# Patient Record
Sex: Male | Born: 1965 | Race: White | Hispanic: No | Marital: Married | Smoking: Never smoker
Health system: Southern US, Community
[De-identification: ages and names within clinical notes are randomized; demographics above are authoritative.]

## PROBLEM LIST (undated history)

## (undated) DIAGNOSIS — M1990 Unspecified osteoarthritis, unspecified site: Secondary | ICD-10-CM

## (undated) DIAGNOSIS — F419 Anxiety disorder, unspecified: Secondary | ICD-10-CM

## (undated) DIAGNOSIS — M4800 Spinal stenosis, site unspecified: Secondary | ICD-10-CM

## (undated) DIAGNOSIS — E119 Type 2 diabetes mellitus without complications: Secondary | ICD-10-CM

## (undated) DIAGNOSIS — Z9889 Other specified postprocedural states: Secondary | ICD-10-CM

## (undated) DIAGNOSIS — J45909 Unspecified asthma, uncomplicated: Secondary | ICD-10-CM

## (undated) DIAGNOSIS — I10 Essential (primary) hypertension: Secondary | ICD-10-CM

## (undated) DIAGNOSIS — L03032 Cellulitis of left toe: Secondary | ICD-10-CM

## (undated) HISTORY — PX: HERNIA REPAIR: SHX51

---

## 2000-07-01 ENCOUNTER — Encounter: Payer: Self-pay | Admitting: General Surgery

## 2000-07-01 ENCOUNTER — Ambulatory Visit (HOSPITAL_COMMUNITY): Admission: RE | Admit: 2000-07-01 | Discharge: 2000-07-01 | Payer: Self-pay | Admitting: General Surgery

## 2001-01-05 ENCOUNTER — Encounter: Admission: RE | Admit: 2001-01-05 | Discharge: 2001-01-05 | Payer: Self-pay | Admitting: Neurology

## 2001-01-05 ENCOUNTER — Encounter: Payer: Self-pay | Admitting: Neurology

## 2001-12-23 ENCOUNTER — Encounter: Admission: RE | Admit: 2001-12-23 | Discharge: 2001-12-23 | Payer: Self-pay | Admitting: Family Medicine

## 2001-12-23 ENCOUNTER — Encounter: Payer: Self-pay | Admitting: Family Medicine

## 2002-06-20 ENCOUNTER — Encounter: Admission: RE | Admit: 2002-06-20 | Discharge: 2002-06-20 | Payer: Self-pay | Admitting: Sports Medicine

## 2002-06-20 ENCOUNTER — Encounter: Payer: Self-pay | Admitting: Sports Medicine

## 2002-07-04 ENCOUNTER — Encounter: Payer: Self-pay | Admitting: Sports Medicine

## 2002-07-04 ENCOUNTER — Encounter: Admission: RE | Admit: 2002-07-04 | Discharge: 2002-07-04 | Payer: Self-pay | Admitting: Sports Medicine

## 2002-08-10 ENCOUNTER — Encounter: Admission: RE | Admit: 2002-08-10 | Discharge: 2002-11-08 | Payer: Self-pay | Admitting: Family Medicine

## 2002-11-09 ENCOUNTER — Other Ambulatory Visit (HOSPITAL_COMMUNITY): Admission: RE | Admit: 2002-11-09 | Discharge: 2002-11-16 | Payer: Self-pay | Admitting: *Deleted

## 2002-12-05 ENCOUNTER — Encounter: Admission: RE | Admit: 2002-12-05 | Discharge: 2002-12-05 | Payer: Self-pay | Admitting: *Deleted

## 2003-01-11 ENCOUNTER — Encounter: Admission: RE | Admit: 2003-01-11 | Discharge: 2003-01-11 | Payer: Self-pay | Admitting: *Deleted

## 2003-02-19 ENCOUNTER — Emergency Department (HOSPITAL_COMMUNITY): Admission: EM | Admit: 2003-02-19 | Discharge: 2003-02-19 | Payer: Self-pay | Admitting: Emergency Medicine

## 2003-03-27 ENCOUNTER — Emergency Department (HOSPITAL_COMMUNITY): Admission: EM | Admit: 2003-03-27 | Discharge: 2003-03-27 | Payer: Self-pay | Admitting: Emergency Medicine

## 2003-03-30 ENCOUNTER — Emergency Department (HOSPITAL_COMMUNITY): Admission: EM | Admit: 2003-03-30 | Discharge: 2003-03-30 | Payer: Self-pay | Admitting: Emergency Medicine

## 2005-07-28 ENCOUNTER — Ambulatory Visit (HOSPITAL_COMMUNITY): Payer: Self-pay | Admitting: Psychiatry

## 2006-10-11 ENCOUNTER — Emergency Department (HOSPITAL_COMMUNITY): Admission: EM | Admit: 2006-10-11 | Discharge: 2006-10-11 | Payer: Self-pay | Admitting: Emergency Medicine

## 2006-10-13 ENCOUNTER — Ambulatory Visit: Payer: Self-pay | Admitting: Internal Medicine

## 2006-11-02 ENCOUNTER — Ambulatory Visit: Payer: Self-pay | Admitting: Internal Medicine

## 2006-11-02 LAB — CONVERTED CEMR LAB
ALT: 46 units/L — ABNORMAL HIGH (ref 0–40)
AST: 35 units/L (ref 0–37)
Albumin: 4.8 g/dL (ref 3.5–5.2)
Alkaline Phosphatase: 53 units/L (ref 39–117)
BUN: 12 mg/dL (ref 6–23)
Basophils Absolute: 0 10*3/uL (ref 0.0–0.1)
Basophils Relative: 0.5 % (ref 0.0–1.0)
CO2: 29 meq/L (ref 19–32)
Calcium: 9.8 mg/dL (ref 8.4–10.5)
Chloride: 102 meq/L (ref 96–112)
Creatinine, Ser: 1 mg/dL (ref 0.4–1.5)
Eosinophils Absolute: 0.5 10*3/uL (ref 0.0–0.6)
Eosinophils Relative: 6.3 % — ABNORMAL HIGH (ref 0.0–5.0)
GFR calc Af Amer: 106 mL/min
GFR calc non Af Amer: 88 mL/min
Glucose, Bld: 98 mg/dL (ref 70–99)
HCT: 40.1 % (ref 39.0–52.0)
Hemoglobin: 14.4 g/dL (ref 13.0–17.0)
IgE (Immunoglobulin E), Serum: 10.9 intl units/mL (ref 0.0–180.0)
Lymphocytes Relative: 26.1 % (ref 12.0–46.0)
MCHC: 36 g/dL (ref 30.0–36.0)
MCV: 92.1 fL (ref 78.0–100.0)
Monocytes Absolute: 0.5 10*3/uL (ref 0.2–0.7)
Monocytes Relative: 6.6 % (ref 3.0–11.0)
Neutro Abs: 4.9 10*3/uL (ref 1.4–7.7)
Neutrophils Relative %: 60.5 % (ref 43.0–77.0)
Platelets: 252 10*3/uL (ref 150–400)
Potassium: 4.3 meq/L (ref 3.5–5.1)
RBC: 4.35 M/uL (ref 4.22–5.81)
RDW: 11.8 % (ref 11.5–14.6)
Sodium: 138 meq/L (ref 135–145)
TSH: 1.31 microintl units/mL (ref 0.35–5.50)
Total Bilirubin: 0.8 mg/dL (ref 0.3–1.2)
Total Protein: 7.5 g/dL (ref 6.0–8.3)
WBC: 8 10*3/uL (ref 4.5–10.5)

## 2009-05-09 DIAGNOSIS — M549 Dorsalgia, unspecified: Secondary | ICD-10-CM | POA: Insufficient documentation

## 2009-05-09 DIAGNOSIS — J310 Chronic rhinitis: Secondary | ICD-10-CM

## 2009-05-09 DIAGNOSIS — J45909 Unspecified asthma, uncomplicated: Secondary | ICD-10-CM | POA: Insufficient documentation

## 2009-05-10 ENCOUNTER — Ambulatory Visit: Payer: Self-pay | Admitting: Internal Medicine

## 2011-02-21 NOTE — Op Note (Signed)
[facility]  Patient:    Blake Jackson, Blake Jackson                     MRN: [id number] Proc. Date: 07/01/00 Adm. Date:  [id number] Attending:  Glenna Fellows Tappan                           Operative Report  PREOPERATIVE DIAGNOSIS:  Right inguinal hernia.  POSTOPERATIVE DIAGNOSIS:  Right inguinal hernia.  PROCEDURE:  Laparoscopic repair right inguinal hernia.  SURGEON:  Lorne Skeens. Hoxworth, M.D.  ANESTHESIA:  General.  BRIEF HISTORY:  Blake Jackson is a 45 year old white male who presents with intermittent pain and bulge in his right groin and on exam has a confirmed right inguinal hernia.  There is no hernia discernible on the left.  Options for repair have been discussed, and we elected to proceed with laparoscopic repair.  The nature of the procedure, its indications, risks of bleeding, infection, and recurrence were discussed and were understood preoperatively. He is now brought to the operating room for this procedure.  DESCRIPTION OF PROCEDURE:  The patient was brought to the operating room and placed in a supine position on the operating table, and general endotracheal anesthesia was induced.  Antibiotics were given preoperatively.  The abdomen and groin were sterilely prepped and draped.  Local anesthesia was used to infiltrate the trocar sites prior to the incision.  A 1 cm incision was made at the umbilicus and dissection carried down to the anterior fascia.  This was transversely incised for 1 cm, and the edge of the right rectus muscle retracted laterally and the preperitoneal space entered bluntly.  The balloon dissector was carefully passed into this space and passed with its tip to the pubic symphysis.  It was then inflated with good bilateral deployment of the balloon.  It was left in place for three minutes and then removed, and the structural balloon placed and CO2 applied.  Laparoscopy revealed adequate dissection of the preperitoneal  space, somewhat better on the right than the left.  I did not dissect the left side.  The symphysis was identified and the Coopers ligament cleared down to the iliac vessels.  The iliac vessels and epigastric vessels were identified.  The peritoneum was further dissected off the anterior abdominal wall laterally out to the anterior superior iliac spine.  The edge of the peritoneum was traced back medially.  The cord structures were identified and were skeletonized.  A good sized cord lipoma was dissected off of the cord structures out of the internal ring and dissected posteriorly.  The edge of the peritoneum was found on the cord structures and was dissected well back posteriorly, and there was no indirect sac.  There was a moderate sized direct hernia found medial to the epigastric vessels.  When the anatomy was clear and after complete dissection, a piece of Prolene mesh was trimmed to 4 x 6 inches and marked in place into the preperitoneal space.  It was unfurled and positioned, and tacked initially to the pubic symphysis and then along the Coopers ligament down to the iliac vessels carefully protecting them.  The mesh was then tacked from caudad to cephalad along the midline. and then the superior edge of the mesh was tacked from medial to lateral, avoiding the epigastric vessels and then tacking it out to the anterior superior iliac spine.  All tacks were placed where they could be felt through  the anterior abdominal wall to avoid nerve injury.  This effected good wide coverage of the direct and indirect spaces.  Peritoneum and cord and lipoma were placed deep to the mesh.  While holding the inferolateral of the corner of the mesh in place, all CO2 was evacuated and the trocars removed.  The fascial defect at the umbilicus was closed with two figure-of-eight sutures of 0 Vicryl.  Skin was closed with interrupted subcuticular 4-0 Monocryl.  Benzoin and Steri-Strips were applied.   Sponge, needle and instrument counts correct.  Dry sterile dressings were applied, and the patient was taken to recover in good condition. DD:  07/01/00 TD:  07/01/00 Job: [id number]

## 2011-02-21 NOTE — Assessment & Plan Note (Signed)
[facility]                             PULMONARY OFFICE NOTE   Blake Jackson, Blake Jackson                     MRN:          [id number]  DATE:10/13/2006                            DOB:          09-30-1966    REASON FOR CONSULTATION:  Cough and dyspnea.   HISTORY:  This is a 45 year old white male who carried a diagnosis of  allergic rhinitis as a child, in fact took shots until he was 37 years  old and outgrew it.  However, over the last year he has been having  intermittent cough that comes and goes along with subjective wheezing  and dyspnea requiring albuterol several times a week, but for the last  month has had to use albuterol several times per day and even now is  only partially responding to albuterol.  Associated with worsening  dyspnea and cough he has had a sensation of excess drainage, and  actually has had discolored sputum for which he has received at least  one course of Zithromax without any benefit in terms of the color of the  sputum.  He was seen in the emergency room and placed on prednisone on  January 6, but is only feeling maybe 50% better since then.  He denies  any pleuritic pain, overt sinus or reflux symptoms at present.   PAST MEDICAL HISTORY:  Significant for asthma and allergies, as noted  above, and chronic back pain for he has been on narcotics for years.   ALLERGIES:  None known.   MEDICATIONS:  Kadian and Cymbalta daily, along with Xanax p.r.n.   SOCIAL HISTORY:  He has never smoked.  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   FAMILY HISTORY:  Possibly positive for allergies and asthma, but he is  not sure.  Otherwise negative as recorded on the work sheet.   REVIEW OF SYSTEMS:  Also recorded on the work sheet and reviewed with  the patient, negative except as outlined above.   PHYSICAL EXAMINATION:  This is a very anxious but not overtly ill,  ambulatory white male in no acute distress.  He is  afebrile, stable vital signs.  HEENT:  Significant for mild nonspecific turbinate edema.  Oropharynx is  clear, no excessive postnasal drainage or cobblestoning. Dentition is  intact.  NECK:  Supple without cervical adenopathy or tenderness.  The trachea  was midline, no thyromegaly.  LUNGS:  Lung fields reveal inspiratory and expiratory rhonchi  bilaterally with diminished but adequate air movement.  There is a regular rhythm without murmur, gallop or rub. S1, S2  diminished.  ABDOMEN:  Moderately obese, otherwise benign.  EXTREMITIES:  Warm without calf tenderness, cyanosis, clubbing or edema.   MDI technique was reviewed, and is only marginally adequate.   Chest x-ray is reviewed from October 11, 2006, and is normal.   Hemoglobin saturation is 96% on room air today.   IMPRESSION:  Rhinitis tracheobronchitis in a patient who has probably  had active asthma for years, but definitely symptomatic for the last  year, with excess use of beta 2 agonists indicating significant  airways  inflammation.  The differential diagnosis includes chronic sinusitis,  especially since he did not clear with antibiotics in terms of his  purulent sputum, and also overt reflux based on his obesity.   I reviewed with him short-term treatment directed at asthma, rhinitis  and reflux as follows:  1. I initiated treatment with Symbicort 80/4.5 two puffs b.i.d., and      asked him to try to eliminate the albuterol completely if possible      while adding Zegerid 40 mg nightly and Levaquin 750 for 7 days to      his regimen.  When he returns in 2 weeks if not improved a sinus CT      scan needs to be done.  2. For his nasal congestion I would like him to use nothing more than      Ocean nasal spray, and for cough and congestion Mucinex DM two      b.i.d., preserving albuterol for true rescue circumstances.      Follow up with me in 2 weeks, sooner if needed.  In terms of his      current prednisone taper, I  have given him an additional 6 days to      get him over the hump in terms of controlling acute airways      inflammation until Symbicort has a chance to be fully effective.     Charlaine Dalton. [name], MD, [facility]  Electronically Signed    MBW/MedQ  DD: 10/13/2006  DT: 10/14/2006  Job #: [id number]   cc:   Chales Salmon. Abigail Miyamoto, M.D.

## 2011-02-21 NOTE — Assessment & Plan Note (Signed)
[facility]                             PULMONARY OFFICE NOTE   Blake Jackson, Blake Jackson                     MRN:          [id number]  DATE:11/02/2006                            DOB:          [birth year]    PULMONARY/EXTENDED FOLLOWUP OFFICE VISIT   HISTORY:  This is a 45 year old white male, never-smoker, with a history  of chronic rhinitis and subjective wheezing with cough with partial but  no full response to prednisone.  I saw him on October 13, 2006, after  seen in the emergency room and only reporting 50% improvement and still  using albuterol excessively.  I recommended Symbicort 80/4.5 two puffs  b.i.d. and a short course of prednisone.  He was suppose to return but,  because of a scheduling conflict, actually ran out of his medicines and  comes back today much worse and, again, only it may be 50% better even  while on prednisone.   The patient complains of subjective wheezing with generalized chest  tightness and a hacking cough productive of scant amounts of green  mucus.  He denies any fevers, chills, or pleuritic pain, overt reflux  symptoms, orthopnea, PND, or leg swelling.   PHYSICAL EXAMINATION:  He is an unhealthy appearing, white male in no  acute distress who appears a bit tremulous.  He is afebrile on vital signs with a pulse rate of only 74.  HEENT:  Remarkably unremarkable.  Oropharynx is clear.  Nasal turbinates  are normal.  External ear canals were clear bilaterally.  NECK:  Supple without cervical adenopathy or tenderness.  Trachea was  midline with no organomegaly.  Lung fields revealed bilateral late expiratory wheezes with poor air  movement overall.  HEART:  Regular rate and rhythm without murmur, gallop, or rub.  ABDOMEN:  Soft, benign.  EXTREMITIES:  Warm without calf tenderness, cyanosis, clubbing, or  edema.   Hemoglobin saturation was 94% on room air.   Lab work is pending.   IMPRESSION:  Extended discussion  with this patient lasting 15-20 minutes  of a 25-minute visit on the following topics:  1. The symptoms seem disproportionate to objective findings and note      that even on prednisone he only gets 50% improvement.  Therefore,      at least part of his problem may not be actually asthma related.      However, the component that is asthma related needs to be maximally      reversed and, therefore, I recommended increasing Symbicort to      160/4.5 and gave him an extra 2 weeks of samples, documenting that      he improves his MDI technique from 50% to 75%.  I also emphasized      compliance with diet directed at gastroesophageal reflux disease      and recommended for breakthrough wheezing to use albumin 2 puffs      every 4 hours.  For cough to use Mucinex-DM 1-2 every 12 hours and      reminded him that he can see either nurse practitioner or me in the  event that a scheduling conflict occurs again.  2. I did give him a 7-day course of doxycycline and another 6-day      course of prednisone because he is reporting slightly purulent      sputum and recommended a sinus CT scan on return if not improving.  3. An IgE level and eosinophil level are pending at the time of this      dictation.     Charlaine Dalton. [name], MD, [facility]  Electronically Signed    MBW/MedQ  DD: 11/02/2006  DT: 11/02/2006  Job #: [id number]

## 2012-04-16 ENCOUNTER — Other Ambulatory Visit: Payer: Self-pay | Admitting: Physician Assistant

## 2012-04-16 DIAGNOSIS — M545 Low back pain: Secondary | ICD-10-CM

## 2012-04-19 ENCOUNTER — Ambulatory Visit
Admission: RE | Admit: 2012-04-19 | Discharge: 2012-04-19 | Disposition: A | Discharge status: Home / Self Care | Payer: BC Managed Care – PPO | Source: Ambulatory Visit | Attending: Physician Assistant | Admitting: Physician Assistant

## 2012-04-19 DIAGNOSIS — M545 Low back pain: Secondary | ICD-10-CM

## 2012-04-20 ENCOUNTER — Ambulatory Visit
Admission: RE | Admit: 2012-04-20 | Discharge: 2012-04-20 | Disposition: A | Discharge status: Home / Self Care | Payer: BC Managed Care – PPO | Source: Ambulatory Visit | Attending: Physician Assistant | Admitting: Physician Assistant

## 2012-04-20 DIAGNOSIS — M545 Low back pain: Secondary | ICD-10-CM

## 2012-06-02 ENCOUNTER — Other Ambulatory Visit: Payer: Self-pay | Admitting: Family Medicine

## 2012-06-11 ENCOUNTER — Other Ambulatory Visit: Payer: BC Managed Care – PPO

## 2019-10-11 ENCOUNTER — Ambulatory Visit (HOSPITAL_COMMUNITY)
Admission: RE | Admit: 2019-10-11 | Discharge: 2019-10-11 | Disposition: A | Discharge status: Home / Self Care | Payer: Self-pay | Attending: Psychiatry | Admitting: Psychiatry

## 2019-10-11 ENCOUNTER — Encounter (HOSPITAL_COMMUNITY): Payer: Self-pay | Admitting: Psychiatry

## 2019-10-11 DIAGNOSIS — F332 Major depressive disorder, recurrent severe without psychotic features: Secondary | ICD-10-CM | POA: Insufficient documentation

## 2019-10-11 NOTE — BH Assessment (Signed)
Assessment Note  Blake Jackson is an 54 y.o. male who presented to [facility] as a walk-in seeking counseling services for his depression.  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  He states that he has been treated for depression in the past with Effexor, but states that he has not been on medications in a couple of years.  Patient states that he has experienced several stressors which have increased his depression.  He states that Covid has kept him from being able to see his mother in the nursing home, his kids are grown and have moved away and he states that he is divorced.  Patient states that he has chronic pain issues that contribute to his depression.  Patient denies any previous or current SI/HI/Psychosis.  He states that he is not a user of drugs or alcohol.  He denies any history of abuse or self-mutilation.  He states that his sleep and appetite have been good.  Patient states that he feels like he just needs someone to talk to and to possibly get back on medications.  He states that he was referred to [facility] for an assessment, but he did not realize that this was an inpatient facility and states that he does not need this level of care. He was alert and oriented, he was able to contract for safety to follow-up with [facility] for therapy and medication management.  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   Family History: No family history on file.  Social History:  reports that he does not drink alcohol or use drugs. No history on file for tobacco.  Additional Social History:  Alcohol / Drug Use Pain Medications: see MAR Prescriptions: see MAR Over the Counter: see MAR History of alcohol / drug use?: No history of alcohol / drug abuse Longest period of sobriety (when/how long): N/A  CIWA: CIWA-Ar BP: 136/90 Pulse Rate: (!) 102 COWS:    Allergies: Not on  File  Home Medications: (Not in a hospital admission)   OB/GYN Status:  No LMP for male patient.  General Assessment Data TTS Assessment: In system Is this a Tele or Face-to-Face Assessment?: Face-to-Face Is this an Initial Assessment or a Re-assessment for this encounter?: Initial Assessment Patient Accompanied by:: Other(friend) Language Other than English: No Living Arrangements: Other (Comment)(has his own home) What gender do you identify as?: Male Marital status: Divorced Living Arrangements: Alone Can pt return to current living arrangement?: Yes Admission Status: Voluntary Is patient capable of signing voluntary admission?: Yes Referral Source: Self/Family/Friend Insurance type: Product/process development scientist Exam [facility] Walk-in ONLY) Medical Exam completed: Yes  Crisis Care Plan Living Arrangements: Alone Legal Guardian: Other:(self) Name of Psychiatrist: none Name of Therapist: none  Education Status Is patient currently in school?: No Is the patient employed, unemployed or receiving disability?: Employed  Risk to self with the past 6 months Suicidal Ideation: No Has patient been a risk to self within the past 6 months prior to admission? : No Suicidal Intent: No Has patient had any suicidal intent within the past 6 months prior to admission? : No Is patient at risk for suicide?: No Suicidal Plan?: No Has patient had any suicidal plan within the past 6 months prior to admission? : No Access to Means: No What has been your use of drugs/alcohol within the last 12 months?: none Previous Attempts/Gestures: No How many  times?: 0 Other Self Harm Risks: none Triggers for Past Attempts: None known Intentional Self Injurious Behavior: None Family Suicide History: No Recent stressful life event(s): Other (Comment) Persecutory voices/beliefs?: (Covid, Mother in Nursing Home, minimal support ) Depression: Yes Depression Symptoms: Despondent, Isolating, Loss of interest in  usual pleasures Substance abuse history and/or treatment for substance abuse?: No Suicide prevention information given to non-admitted patients: Not applicable  Risk to Others within the past 6 months Homicidal Ideation: No Does patient have any lifetime risk of violence toward others beyond the six months prior to admission? : No Thoughts of Harm to Others: No Current Homicidal Intent: No Current Homicidal Plan: No Access to Homicidal Means: No Identified Victim: none History of harm to others?: No Assessment of Violence: None Noted Violent Behavior Description: (none) Does patient have access to weapons?: No Criminal Charges Pending?: No Does patient have a court date: No Is patient on probation?: No  Psychosis Hallucinations: None noted Delusions: None noted  Mental Status Report Appearance/Hygiene: Unremarkable Eye Contact: Good Motor Activity: Rigidity, Unremarkable Speech: Logical/coherent Level of Consciousness: Alert Mood: Depressed, Anxious Affect: Depressed Anxiety Level: Moderate Thought Processes: Coherent, Relevant Judgement: Unimpaired Orientation: Person, Place, Time, Situation Obsessive Compulsive Thoughts/Behaviors: None  Cognitive Functioning Concentration: Normal Memory: Recent Intact, Remote Intact Is patient IDD: No Insight: Good Impulse Control: Good Appetite: Good Have you had any weight changes? : No Change Sleep: No Change Total Hours of Sleep: 8 Vegetative Symptoms: None  ADLScreening [facility] Assessment Services) Patient's cognitive ability adequate to safely complete daily activities?: Yes Patient able to express need for assistance with ADLs?: Yes Independently performs ADLs?: Yes (appropriate for developmental age)  Prior Inpatient Therapy Prior Inpatient Therapy: No  Prior Outpatient Therapy Prior Outpatient Therapy: No Does patient have an ACCT team?: No Does patient have Intensive In-House Services?  : No Does patient have  [facility] services? : No Does patient have P4CC services?: No  ADL Screening (condition at time of admission) Patient's cognitive ability adequate to safely complete daily activities?: Yes Is the patient deaf or have difficulty hearing?: No Does the patient have difficulty seeing, even when wearing glasses/contacts?: No Does the patient have difficulty concentrating, remembering, or making decisions?: No Patient able to express need for assistance with ADLs?: Yes Does the patient have difficulty dressing or bathing?: No Independently performs ADLs?: Yes (appropriate for developmental age) Does the patient have difficulty walking or climbing stairs?: No Weakness of Legs: None Weakness of Arms/Hands: None  Home Assistive Devices/Equipment Home Assistive Devices/Equipment: None  Therapy Consults (therapy consults require a physician order) PT Evaluation Needed: No OT Evalulation Needed: No SLP Evaluation Needed: No Abuse/Neglect Assessment (Assessment to be complete while patient is alone) Abuse/Neglect Assessment Can Be Completed: Yes Physical Abuse: Denies Verbal Abuse: Denies Sexual Abuse: Denies Exploitation of patient/patient's resources: Denies Self-Neglect: Denies Values / Beliefs Cultural Requests During Hospitalization: None Spiritual Requests During Hospitalization: None Consults Spiritual Care Consult Needed: No Transition of Care Team Consult Needed: No Advance Directives (For Healthcare) Does Patient Have a Medical Advance Directive?: No Would patient like information on creating a medical advance directive?: No - Patient declined Nutrition Screen- [facility] Adult/[facility]/AP Has the patient recently lost weight without trying?: No Has the patient been eating poorly because of a decreased appetite?: No Malnutrition Screening Tool Score: 0        Disposition: Per Denzil Magnuson, NP, patient does not meet inpatient admission criteria.  Disposition Initial Assessment  Completed for this Encounter: Yes Disposition of Patient: Discharge  Patient refused recommended treatment: No Mode of transportation if patient is discharged/movement?: Car Patient referred to: Outpatient clinic referral  On Site Evaluation by:   Reviewed with Physician:    Judeth Porch Mada Sadik 10/11/2019 11:23 AM

## 2019-10-11 NOTE — H&P (Signed)
Behavioral Health Medical Screening Exam  Blake Jackson is an 54 y.o. male.who presents as a walk-inn to [facility] voluntarily. Patient denies SI, HI or psychosis.  He endorses a history of seasonal depression which worsens in the winter months. He identifies current triggers as his two older children living in other states, restrictions due to COVID, and not having much physical contact with his mother due to COVID restrictions. He endorses anxiety and reports lately, it has been difficult for him to be in crowds. Reports not having much joy in life. He denies prior suicide attempts or other acts of self-harm. Denies concerns with sleep although reports fluctuations  in appetite but no weight loss. Reports being on Effexor int he past for depression although reports he is on no medication for depression at this time. Reports having no current outpatient psychiatric services to include a therapist or psychiatrists. Reports there is a firearm in the home although he has never felt suicidal or had thoughts of using the firearm to harm himself. He denies substance abuse or use.   Total Time spent with patient: 20 minutes  Psychiatric Specialty Exam: Physical Exam  Vitals reviewed. Constitutional: He is oriented to person, place, and time.  Neurological: He is alert and oriented to person, place, and time.    Review of Systems  Psychiatric/Behavioral: Negative for sleep disturbance and suicidal ideas.       Depression   All other systems reviewed and are negative.   There were no vitals taken for this visit.There is no height or weight on file to calculate BMI.  General Appearance: Well Groomed  Eye Contact:  Good  Speech:  Clear and Coherent and Normal Rate  Volume:  Normal  Mood:  Anxious and Depressed  Affect:  Congruent  Thought Process:  Coherent, Goal Directed, Linear and Descriptions of Associations: Intact  Orientation:  Full (Time, Place, and Person)  Thought Content:  Logical  Suicidal  Thoughts:  No  Homicidal Thoughts:  No  Memory:  Immediate;   Fair Recent;   Fair  Judgement:  Fair  Insight:  Fair  Psychomotor Activity:  Normal  Concentration: Concentration: Fair and Attention Span: Fair  Recall:  [employer] of Knowledge:Fair  Language: Good  Akathisia:  Negative  Handed:  Right  AIMS (if indicated):     Assets:  Communication Skills Desire for Improvement Resilience Social Support  Sleep:       Musculoskeletal: Strength & Muscle Tone: within normal limits Gait & Station: normal Patient leans: N/A  There were no vitals taken for this visit.  Recommendations:  Based on my evaluation the patient does not appear to have an emergency medical condition.   No evidence of imminent risk to self or others at present.   Patient does not meet criteria for psychiatric inpatient admission. Community resources were provided for patient to get established with an outpatient therapist/ psychiatrists. IOP program was discussed as an option along with following up with his PCP to discuss treatment options for depression and anxiety of in-fact, he can not get a quick appointment with resources provided. We also discussed that of symptoms  worsen or do not continue to improve or if the patient becomes actively suicidal or homicidal then it is recommended that the patient return to the closest hospital emergency room or call 911 for further evaluation and treatment or return back to [facility].   Denzil Magnuson, NP 10/11/2019, 10:58 AM

## 2021-03-19 ENCOUNTER — Other Ambulatory Visit: Payer: Self-pay | Admitting: Physical Medicine and Rehabilitation

## 2021-03-19 ENCOUNTER — Other Ambulatory Visit: Payer: Self-pay

## 2021-03-19 ENCOUNTER — Ambulatory Visit
Admission: RE | Admit: 2021-03-19 | Discharge: 2021-03-19 | Disposition: A | Discharge status: Home / Self Care | Payer: Self-pay | Source: Ambulatory Visit | Attending: Physical Medicine and Rehabilitation | Admitting: Physical Medicine and Rehabilitation

## 2021-03-19 DIAGNOSIS — B54 Unspecified malaria: Secondary | ICD-10-CM

## 2023-05-24 IMAGING — CR DG KNEE COMPLETE 4+V*L*
4 series · 4 of 4 positions shown · non-contrast
Comparison: None.

CLINICAL DATA: Left knee pain.

EXAM:
LEFT KNEE - COMPLETE 4+ VIEW

[w knee ap left]
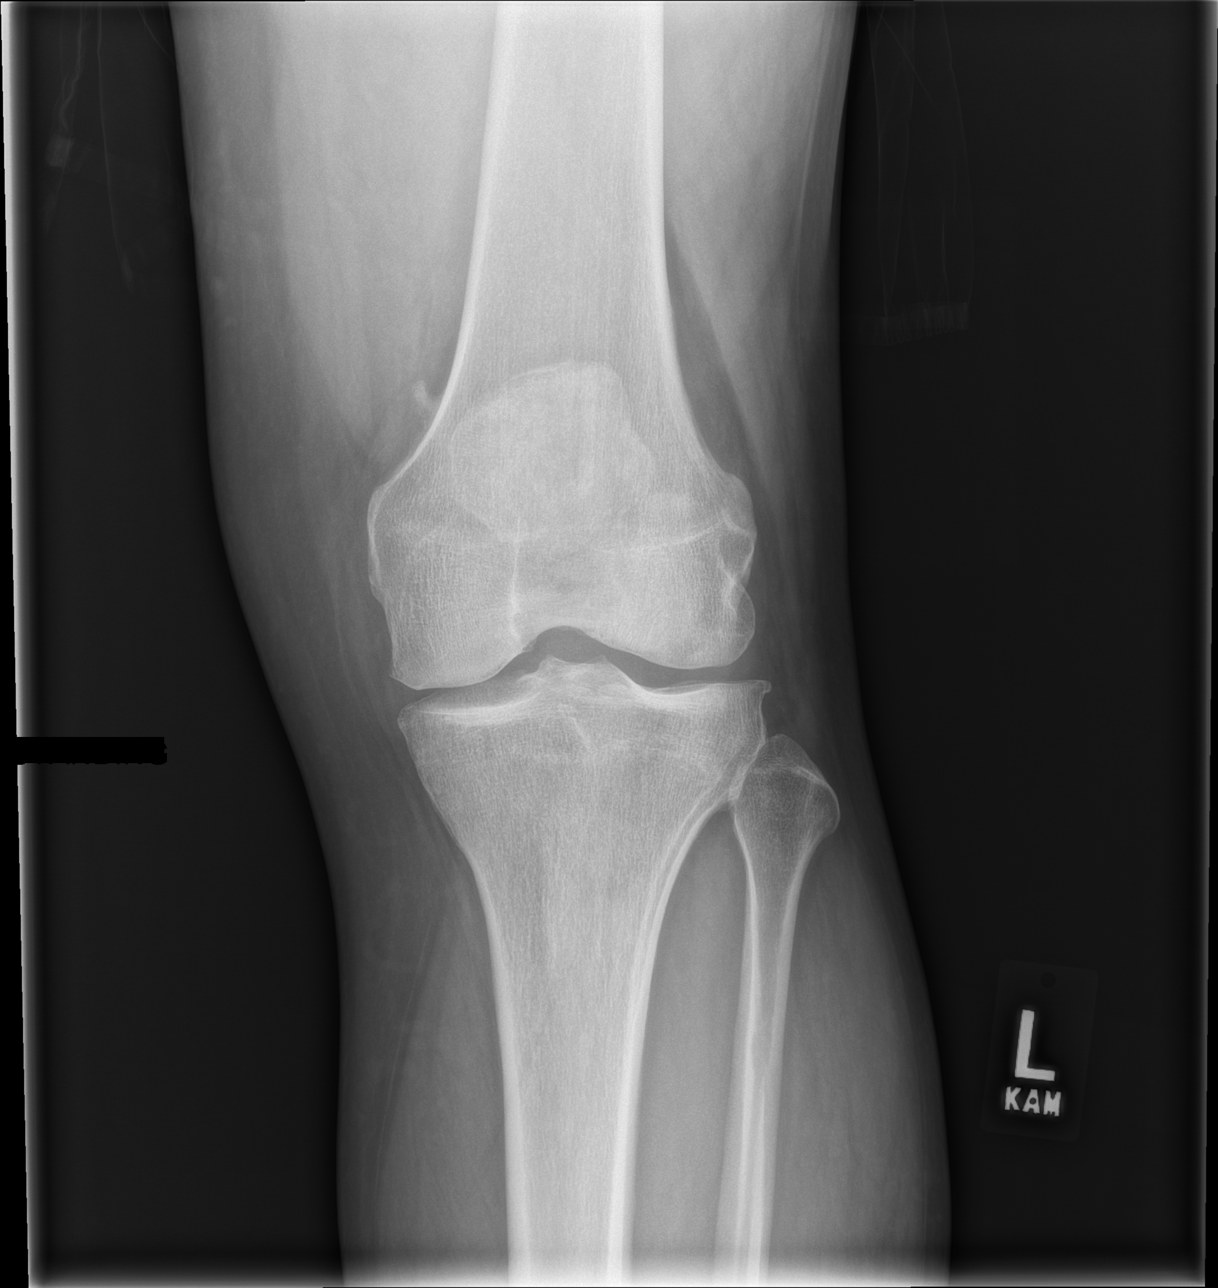

[w knee lat left]
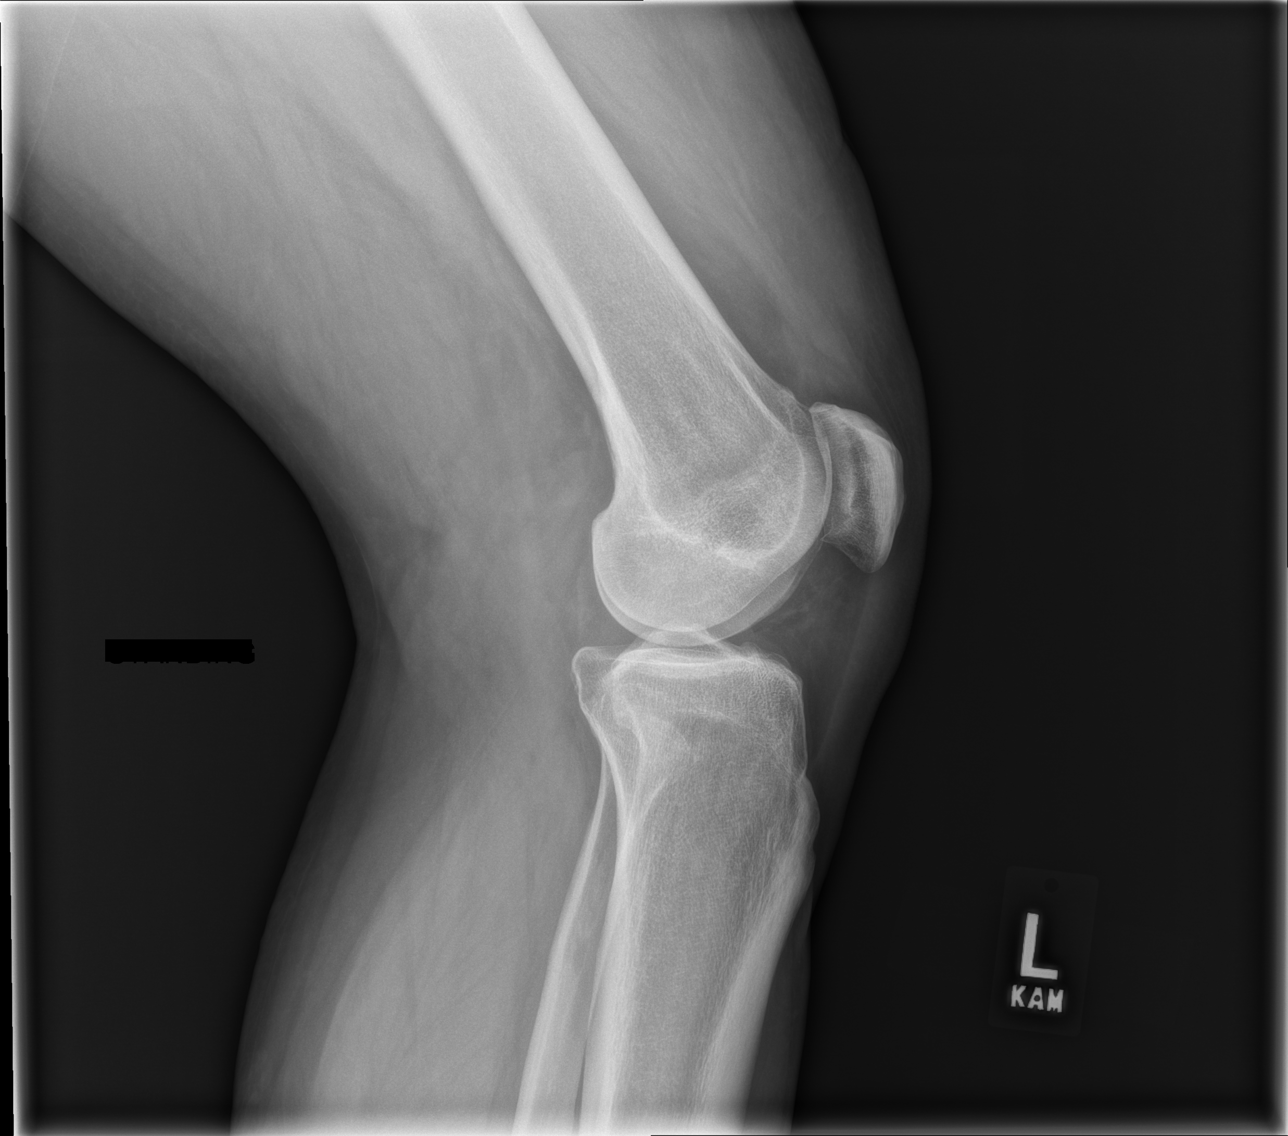

[w knee tunnel pa left]
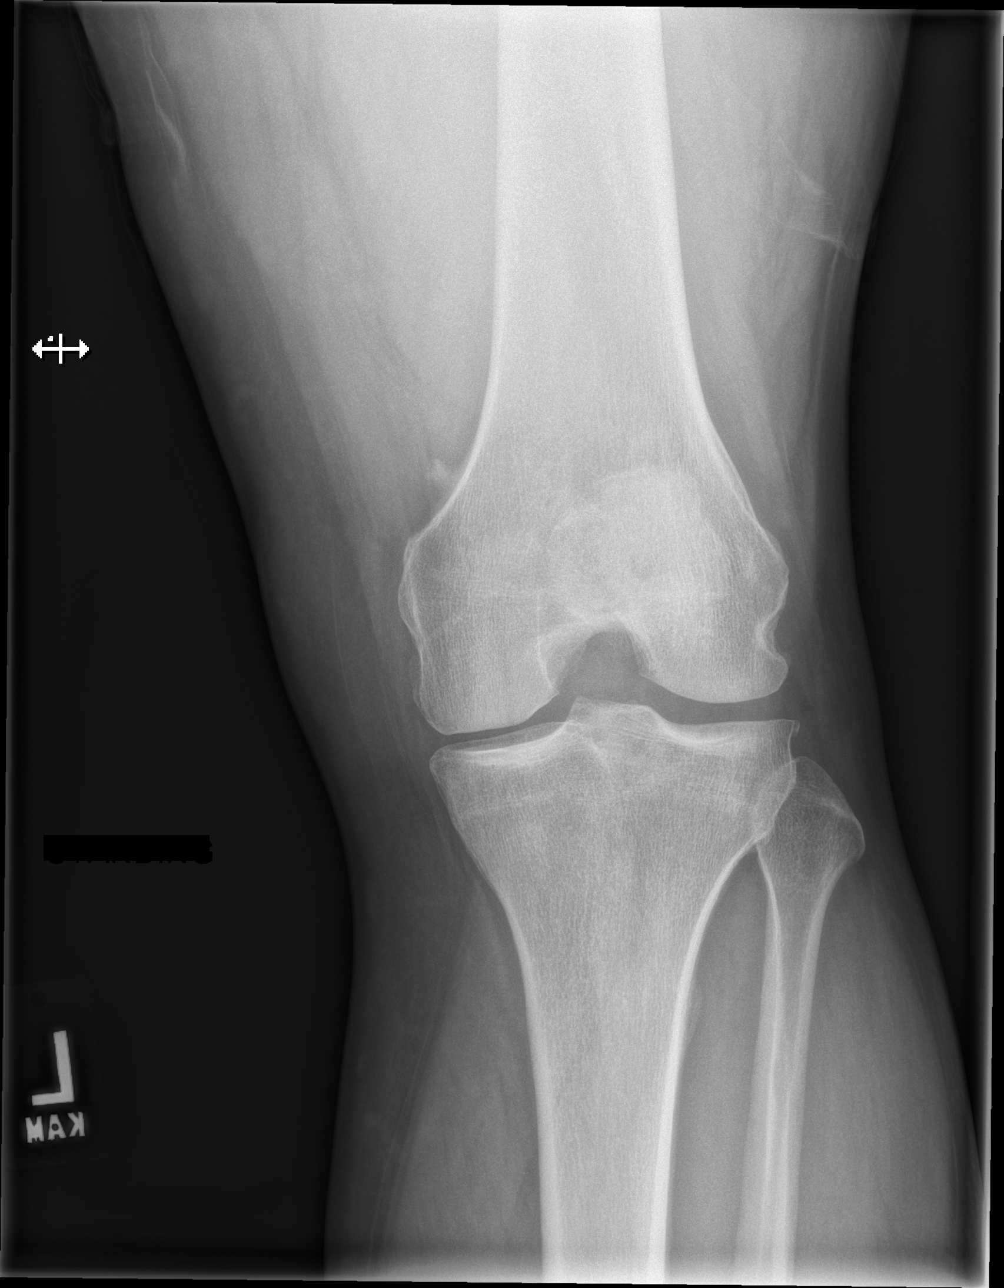

[x knee sunrise left]
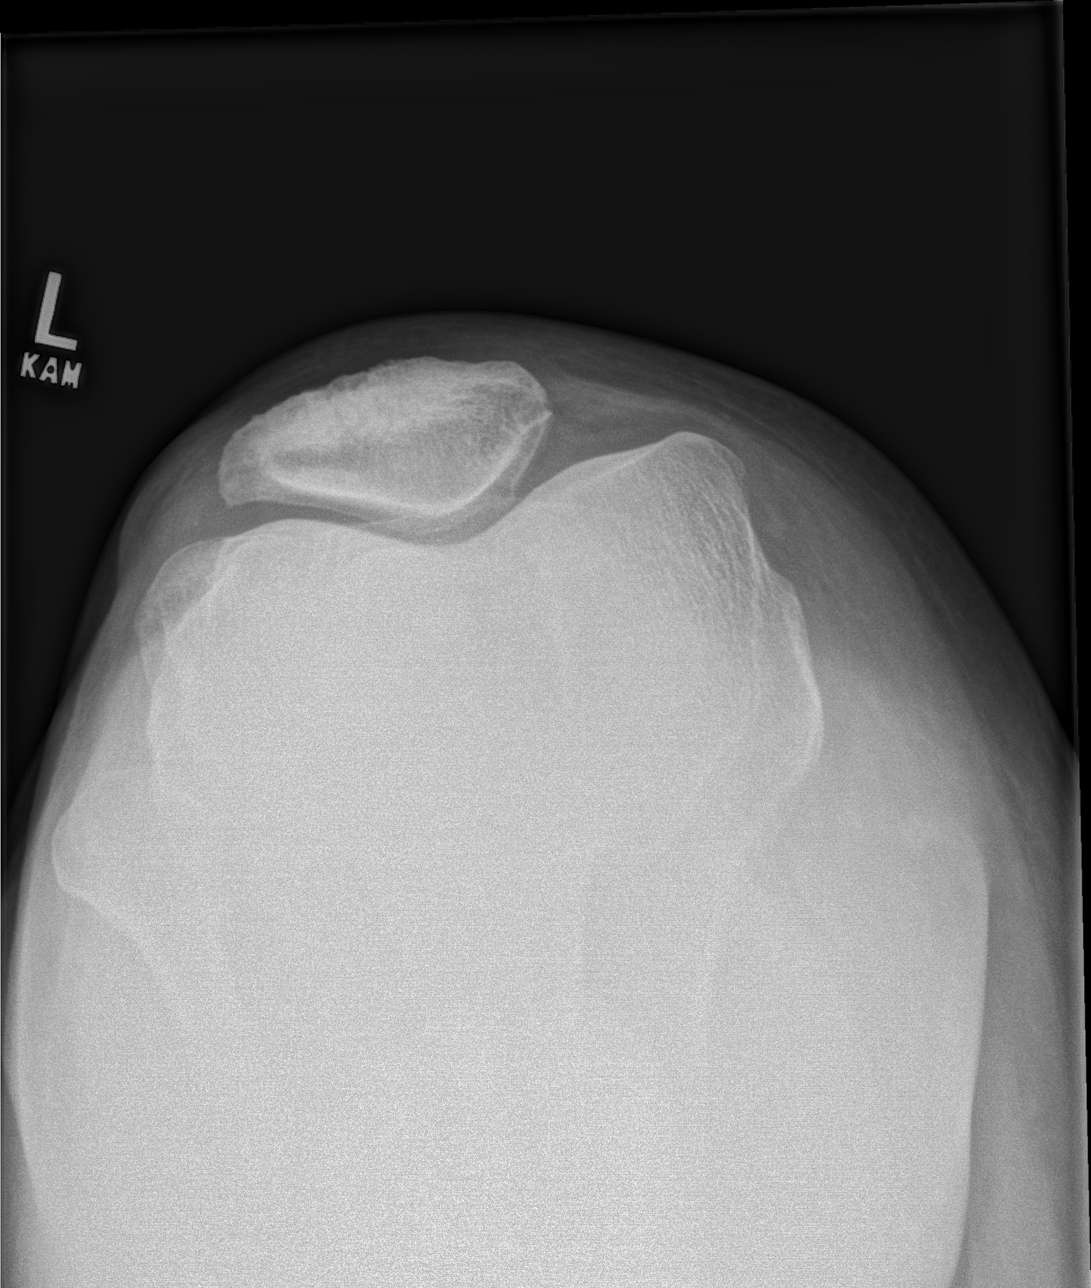

[4 of 4 positions shown; findings below may reference images not displayed]

FINDINGS: Mild moderate degenerative changes with early joint space narrowing
and spurring. No acute fracture or osteochondral lesion. There is a
small osteochondroma projecting off the medial femoral metaphysis.
No joint effusion.
IMPRESSION: Mild-to-moderate degenerative changes but no acute bony findings or
joint effusion.

## 2023-10-22 ENCOUNTER — Encounter: Payer: Self-pay | Admitting: Orthopedic Surgery

## 2023-10-22 ENCOUNTER — Ambulatory Visit: Payer: Managed Care, Other (non HMO) | Admitting: Orthopedic Surgery

## 2023-10-22 ENCOUNTER — Other Ambulatory Visit (INDEPENDENT_AMBULATORY_CARE_PROVIDER_SITE_OTHER): Payer: Managed Care, Other (non HMO)

## 2023-10-22 DIAGNOSIS — M869 Osteomyelitis, unspecified: Secondary | ICD-10-CM

## 2023-10-22 DIAGNOSIS — M79672 Pain in left foot: Secondary | ICD-10-CM

## 2023-10-22 NOTE — Progress Notes (Signed)
Office Visit Note   Patient: Blake Jackson           Date of Birth: 01-21-1966           MRN: [id number] Visit Date: 10/22/2023              Requested by: No referring provider defined for this encounter. PCP: Patient, No Pcp Per  Chief Complaint  Patient presents with   Left Foot - Open Wound      HPI: Patient is a 58 year old gentleman who is seen urgently for initial evaluation for swelling ulceration of the great toe and second toe left foot.  Patient states he is type II diabetic he states his most recent hemoglobin A1c is 5.9.  Patient states he noticed the ulceration 5 months ago.  He was started on doxycycline yesterday.  He also received a intramuscular antibiotic injection.  Assessment & Plan: Visit Diagnoses:  1. Pain in left foot   2. Osteomyelitis of great toe of left foot (HCC)   3. Osteomyelitis of second toe of left foot (HCC)     Plan: Recommended proceeding with amputation of the great toe and second toe left foot.  Risks and benefits were discussed including risk of the wound not healing need for additional surgery.  Patient states he understands wished to proceed at this time he states he would like to proceed with surgery in 2 weeks.  Plan for outpatient surgery.  Follow-Up Instructions: Return in about 4 weeks (around 11/19/2023).   Ortho Exam  Patient is alert, oriented, no adenopathy, well-dressed, normal affect, normal respiratory effort. Examination patient has a strong palpable dorsalis pedis and posterior tibial pulse on the left.  He has sausage digit swelling of the great toe and second toe with cellulitis.  The ulcer beneath the great toe and second toe both probes to bone.  The ulcer on the great toe probes to multiple bone fragments.  Radiographs are consistent with chronic osteomyelitis.  Imaging: XR Foot Complete Left Result Date: 10/22/2023 Three-view radiographs of the left foot shows extensive destructive osteomyelitis of the tuft and  proximal phalanx of the great toe.  Radiographs also show cortical irregularity of the tuft of the second toe.     Labs: No results found for: "HGBA1C", "ESRSEDRATE", "CRP", "LABURIC", "REPTSTATUS", "GRAMSTAIN", "CULT", "LABORGA"   Lab Results  Component Value Date   ALBUMIN 4.8 11/02/2006    No results found for: "MG" No results found for: "VD25OH"  No results found for: "PREALBUMIN"    Latest Ref Rng & Units 11/02/2006   12:48 PM  CBC EXTENDED  WBC 4.5 - 10.5 10*3/microliter 8.0   RBC 4.22 - 5.81 M/uL 4.35   Hemoglobin 13.0 - 17.0 g/dL 27.2   HCT 53.6 - 64.4 % 40.1   Platelets 150 - 400 K/uL 252   NEUT# 1.4 - 7.7 K/uL 4.9      There is no height or weight on file to calculate BMI.  Orders:  Orders Placed This Encounter  Procedures   XR Foot Complete Left   No orders of the defined types were placed in this encounter.    Procedures: No procedures performed  Clinical Data: No additional findings.  ROS:  All other systems negative, except as noted in the HPI. Review of Systems  Objective: Vital Signs: There were no vitals taken for this visit.  Specialty Comments:  No specialty comments available.  PMFS History: Patient Active Problem List   Diagnosis Date Noted  RHINITIS, CHRONIC 05/09/2009   ASTHMA 05/09/2009   BACK PAIN, CHRONIC 05/09/2009   History reviewed. No pertinent past medical history.  History reviewed. No pertinent family history.  History reviewed. No pertinent surgical history. Social History   Occupational History   Not on file  Tobacco Use   Smoking status: Unknown   Smokeless tobacco: Not on file  Vaping Use   Vaping status: Unknown  Substance and Sexual Activity   Alcohol use: Never   Drug use: Never   Sexual activity: Not on file

## 2023-11-16 ENCOUNTER — Encounter (HOSPITAL_COMMUNITY): Payer: Self-pay | Admitting: Orthopedic Surgery

## 2023-11-17 ENCOUNTER — Other Ambulatory Visit: Payer: Self-pay

## 2023-11-17 ENCOUNTER — Encounter (HOSPITAL_COMMUNITY): Payer: Self-pay | Admitting: Orthopedic Surgery

## 2023-11-17 NOTE — Pre-Procedure Instructions (Signed)
-------------    SDW INSTRUCTIONS given:  Your procedure is scheduled on 2/12.  Report to [facility] Main Entrance "A" at 06:00 A.M., and check in at the Admitting office.  Any questions or running late day of surgery: call [phone]    Remember:  Do not eat after midnight the night before your surgery  You may drink clear liquids until 05:30 AM the morning of your surgery.   Clear liquids allowed are: Water, Non-Citrus Juices (without pulp), Carbonated Beverages, Clear Tea, Black Coffee Only, and Gatorade    Take these medicines the morning of surgery with A SIP OF WATER  Albuterol inhaler PRN- bring inhaler with you Xanax PRN Morphine Percocet PRN lyrica    As of today, STOP taking any Aspirin (unless otherwise instructed by your surgeon) Aleve, Naproxen, Ibuprofen, Motrin, Advil, Goody's, BC's, all herbal medications, fish oil, and all vitamins.   Do NOT Smoke (Tobacco/Vaping) 24 hours prior to your procedure  If you use a CPAP at night, you may bring all equipment for your overnight stay.     You will be asked to remove any contacts, glasses, piercing's, hearing aid's, dentures/partials prior to surgery. Please bring cases for these items if needed.     Patients discharged the day of surgery will not be allowed to drive home, and someone needs to stay with them for 24 hours.  SURGICAL WAITING ROOM VISITATION Patients may have no more than 2 support people in the waiting area - these visitors may rotate.   Pre-op nurse will coordinate an appropriate time for 1 ADULT support person, who may not rotate, to accompany patient in pre-op.  Children under the age of 13 must have an adult with them who is not the patient and must remain in the main waiting area with an adult.  If the patient needs to stay at the hospital during part of their recovery, the visitor guidelines for inpatient rooms apply.  Please refer to the [facility] website for the visitor guidelines for any  additional information.   Special instructions:   [place]- Preparing For Surgery   Please follow these instructions carefully.   Shower the NIGHT BEFORE SURGERY and the MORNING OF SURGERY with DIAL Soap.   Pat yourself dry with a CLEAN TOWEL.  Wear CLEAN PAJAMAS to bed the night before surgery  Place CLEAN SHEETS on your bed the night of your first shower and DO NOT SLEEP WITH PETS.   Additional instructions for the day of surgery: DO NOT APPLY any lotions, deodorants, cologne, or perfumes.   Do not wear jewelry or makeup Do not wear nail polish, gel polish, artificial nails, or any other type of covering on natural nails (fingers and toes) Do not bring valuables to the hospital. [facility] is not responsible for valuables/personal belongings. Put on clean/comfortable clothes.  Please brush your teeth.  Ask your nurse before applying any prescription medications to the skin.

## 2023-11-17 NOTE — Progress Notes (Signed)
PCP - Dr. Geoffry Paradise Cardiologist - denies  PPM/ICD - denies  Chest x-ray - 11/26/10- CE EKG - DOS Stress Test - denies ECHO - denies Cardiac Cath - denies  CPAP - denies  DM- pt does not check CBG at home and does not know typical fasting levels  ASA/Blood Thinner Instructions: n/a   ERAS Protcol - clears until 0530  COVID TEST- n/a  Anesthesia review: no  Patient verbally denies any shortness of breath, fever, cough and chest pain during phone call       Questions were answered. Patient verbalized understanding of instructions.

## 2023-11-18 ENCOUNTER — Ambulatory Visit (HOSPITAL_BASED_OUTPATIENT_CLINIC_OR_DEPARTMENT_OTHER): Payer: Managed Care, Other (non HMO) | Admitting: Anesthesiology

## 2023-11-18 ENCOUNTER — Ambulatory Visit (HOSPITAL_COMMUNITY)
Admission: RE | Admit: 2023-11-18 | Discharge: 2023-11-18 | Disposition: A | Discharge status: Home / Self Care | Payer: Managed Care, Other (non HMO) | Attending: Orthopedic Surgery | Admitting: Orthopedic Surgery

## 2023-11-18 ENCOUNTER — Encounter (HOSPITAL_COMMUNITY): Payer: Self-pay | Admitting: Orthopedic Surgery

## 2023-11-18 ENCOUNTER — Encounter (HOSPITAL_COMMUNITY)
Admission: RE | Disposition: A | Discharge status: Home / Self Care | Payer: Self-pay | Source: Home / Self Care | Attending: Orthopedic Surgery

## 2023-11-18 ENCOUNTER — Ambulatory Visit (HOSPITAL_COMMUNITY): Payer: Managed Care, Other (non HMO) | Admitting: Anesthesiology

## 2023-11-18 ENCOUNTER — Other Ambulatory Visit: Payer: Self-pay

## 2023-11-18 DIAGNOSIS — M869 Osteomyelitis, unspecified: Secondary | ICD-10-CM | POA: Diagnosis not present

## 2023-11-18 DIAGNOSIS — E1169 Type 2 diabetes mellitus with other specified complication: Secondary | ICD-10-CM | POA: Diagnosis not present

## 2023-11-18 DIAGNOSIS — E119 Type 2 diabetes mellitus without complications: Secondary | ICD-10-CM | POA: Insufficient documentation

## 2023-11-18 DIAGNOSIS — J45909 Unspecified asthma, uncomplicated: Secondary | ICD-10-CM

## 2023-11-18 DIAGNOSIS — Z7984 Long term (current) use of oral hypoglycemic drugs: Secondary | ICD-10-CM | POA: Insufficient documentation

## 2023-11-18 DIAGNOSIS — I10 Essential (primary) hypertension: Secondary | ICD-10-CM | POA: Diagnosis not present

## 2023-11-18 HISTORY — DX: Spinal stenosis, site unspecified: M48.00

## 2023-11-18 HISTORY — DX: Unspecified asthma, uncomplicated: J45.909

## 2023-11-18 HISTORY — PX: AMPUTATION: SHX166

## 2023-11-18 HISTORY — DX: Essential (primary) hypertension: I10

## 2023-11-18 HISTORY — DX: Unspecified osteoarthritis, unspecified site: M19.90

## 2023-11-18 HISTORY — DX: Type 2 diabetes mellitus without complications: E11.9

## 2023-11-18 HISTORY — DX: Anxiety disorder, unspecified: F41.9

## 2023-11-18 HISTORY — DX: Cellulitis of left toe: L03.032

## 2023-11-18 LAB — CBC
HCT: 34.5 % — ABNORMAL LOW (ref 39.0–52.0)
Hemoglobin: 11.4 g/dL — ABNORMAL LOW (ref 13.0–17.0)
MCH: 31.4 pg (ref 26.0–34.0)
MCHC: 33 g/dL (ref 30.0–36.0)
MCV: 95 fL (ref 80.0–100.0)
Platelets: 319 10*3/uL (ref 150–400)
RBC: 3.63 MIL/uL — ABNORMAL LOW (ref 4.22–5.81)
RDW: 13.7 % (ref 11.5–15.5)
WBC: 7 10*3/uL (ref 4.0–10.5)
nRBC: 0 % (ref 0.0–0.2)

## 2023-11-18 LAB — BASIC METABOLIC PANEL
Anion gap: 9 (ref 5–15)
BUN: 7 mg/dL (ref 6–20)
CO2: 26 mmol/L (ref 22–32)
Calcium: 9.1 mg/dL (ref 8.9–10.3)
Chloride: 97 mmol/L — ABNORMAL LOW (ref 98–111)
Creatinine, Ser: 0.77 mg/dL (ref 0.61–1.24)
GFR, Estimated: >60 mL/min (ref >60)
Glucose, Bld: 204 mg/dL — ABNORMAL HIGH (ref 70–99)
Potassium: 4.5 mmol/L (ref 3.5–5.1)
Sodium: 132 mmol/L — ABNORMAL LOW (ref 135–145)

## 2023-11-18 LAB — GLUCOSE, CAPILLARY
Glucose-Capillary: 202 mg/dL — ABNORMAL HIGH (ref 70–99)
Glucose-Capillary: 191 mg/dL — ABNORMAL HIGH (ref 70–99)

## 2023-11-18 SURGERY — AMPUTATION DIGIT
Anesthesia: General | Site: Foot | Laterality: Left

## 2023-11-18 MED ORDER — CHLORHEXIDINE GLUCONATE 0.12 % MT SOLN
15.0000 mL | Freq: Once | OROMUCOSAL | Status: AC
  Administered 2023-11-18: 15 mL via OROMUCOSAL
  Filled 2023-11-18: qty 15

## 2023-11-18 MED ORDER — 0.9 % SODIUM CHLORIDE (POUR BTL) OPTIME
TOPICAL | Status: DC | PRN
Start: 2023-11-18 — End: 2023-11-18
  Administered 2023-11-18: 1000 mL

## 2023-11-18 MED ORDER — LACTATED RINGERS IV SOLN: INTRAVENOUS | Status: DC

## 2023-11-18 MED ORDER — OXYCODONE-ACETAMINOPHEN 10-325 MG PO TABS: 1.0000 | ORAL_TABLET | ORAL | 0 refills | Status: DC | PRN

## 2023-11-18 MED ORDER — FENTANYL CITRATE (PF) 250 MCG/5ML IJ SOLN
INTRAMUSCULAR | Status: AC
  Filled 2023-11-18: qty 5

## 2023-11-18 MED ORDER — PROPOFOL 10 MG/ML IV BOLUS
INTRAVENOUS | Status: AC
  Filled 2023-11-18: qty 20

## 2023-11-18 MED ORDER — VANCOMYCIN HCL 1000 MG IV SOLR
INTRAVENOUS | Status: DC | PRN
Start: 2023-11-18 — End: 2023-11-18
  Administered 2023-11-18: 1000 mg via TOPICAL

## 2023-11-18 MED ORDER — INSULIN ASPART 100 UNIT/ML IJ SOLN
0.0000 [IU] | INTRAMUSCULAR | Status: DC | PRN
  Administered 2023-11-18: 4 [IU] via SUBCUTANEOUS
  Filled 2023-11-18: qty 1

## 2023-11-18 MED ORDER — ONDANSETRON HCL 4 MG/2ML IJ SOLN
INTRAMUSCULAR | Status: DC | PRN
  Administered 2023-11-18: 4 mg via INTRAVENOUS

## 2023-11-18 MED ORDER — PROPOFOL 10 MG/ML IV BOLUS
INTRAVENOUS | Status: DC | PRN
  Administered 2023-11-18: 200 mg via INTRAVENOUS

## 2023-11-18 MED ORDER — MIDAZOLAM HCL 2 MG/2ML IJ SOLN
INTRAMUSCULAR | Status: AC
  Filled 2023-11-18: qty 2

## 2023-11-18 MED ORDER — ORAL CARE MOUTH RINSE: 15.0000 mL | Freq: Once | OROMUCOSAL | Status: AC

## 2023-11-18 MED ORDER — FENTANYL CITRATE (PF) 250 MCG/5ML IJ SOLN
INTRAMUSCULAR | Status: DC | PRN
  Administered 2023-11-18: 100 ug via INTRAVENOUS
  Administered 2023-11-18: 50 ug via INTRAVENOUS

## 2023-11-18 MED ORDER — CEFAZOLIN SODIUM-DEXTROSE 2-4 GM/100ML-% IV SOLN
2.0000 g | INTRAVENOUS | Status: AC
  Administered 2023-11-18: 2 g via INTRAVENOUS
  Filled 2023-11-18: qty 100

## 2023-11-18 MED ORDER — MIDAZOLAM HCL 5 MG/5ML IJ SOLN
INTRAMUSCULAR | Status: DC | PRN
  Administered 2023-11-18: 2 mg via INTRAVENOUS

## 2023-11-18 SURGICAL SUPPLY — 28 items
BAG COUNTER SPONGE SURGICOUNT (BAG) ×1 IMPLANT
BLADE SURG 21 STRL SS (BLADE) ×1 IMPLANT
BNDG COHESIVE 4X5 TAN STRL (GAUZE/BANDAGES/DRESSINGS) ×1 IMPLANT
BNDG ESMARK 4X9 LF (GAUZE/BANDAGES/DRESSINGS) IMPLANT
BNDG GAUZE DERMACEA FLUFF 4 (GAUZE/BANDAGES/DRESSINGS) ×1 IMPLANT
CLEANSER WND VASHE 34 (WOUND CARE) IMPLANT
COVER SURGICAL LIGHT HANDLE (MISCELLANEOUS) ×2 IMPLANT
DRAPE U-SHAPE 47X51 STRL (DRAPES) ×1 IMPLANT
DRSG ADAPTIC 3X8 NADH LF (GAUZE/BANDAGES/DRESSINGS) IMPLANT
DURAPREP 26ML APPLICATOR (WOUND CARE) ×1 IMPLANT
ELECT REM PT RETURN 9FT ADLT (ELECTROSURGICAL) ×1
ELECTRODE REM PT RTRN 9FT ADLT (ELECTROSURGICAL) ×1 IMPLANT
GAUZE PAD ABD 8X10 STRL (GAUZE/BANDAGES/DRESSINGS) ×1 IMPLANT
GAUZE SPONGE 4X4 12PLY STRL (GAUZE/BANDAGES/DRESSINGS) IMPLANT
GLOVE BIOGEL PI IND STRL 9 (GLOVE) ×1 IMPLANT
GLOVE SURG ORTHO 9.0 STRL STRW (GLOVE) ×1 IMPLANT
GOWN STRL REUS W/ TWL XL LVL3 (GOWN DISPOSABLE) ×2 IMPLANT
KIT BASIN OR (CUSTOM PROCEDURE TRAY) ×1 IMPLANT
KIT TURNOVER KIT B (KITS) ×1 IMPLANT
MANIFOLD NEPTUNE II (INSTRUMENTS) ×1 IMPLANT
NDL 22X1.5 STRL (OR ONLY) (MISCELLANEOUS) IMPLANT
NEEDLE 22X1.5 STRL (OR ONLY) (MISCELLANEOUS)
NS IRRIG 1000ML POUR BTL (IV SOLUTION) ×1 IMPLANT
PACK ORTHO EXTREMITY (CUSTOM PROCEDURE TRAY) ×1 IMPLANT
PAD ARMBOARD 7.5X6 YLW CONV (MISCELLANEOUS) ×2 IMPLANT
SUT ETHILON 2 0 PSLX (SUTURE) ×1 IMPLANT
SYR CONTROL 10ML LL (SYRINGE) IMPLANT
TOWEL GREEN STERILE (TOWEL DISPOSABLE) ×1 IMPLANT

## 2023-11-18 NOTE — Op Note (Signed)
11/18/2023  9:02 AM  PATIENT:  Blake Jackson    PRE-OPERATIVE DIAGNOSIS:  Left Foot Osteomyelitis Great Toe and 2nd Toe  POST-OPERATIVE DIAGNOSIS:  Same  PROCEDURE:  LEFT FOOT AMPUTATION GREAT TOE AND 2ND TOE Application of vancomycin powder 1 g.  SURGEON:  Nadara Mustard, MD  PHYSICIAN ASSISTANT:None ANESTHESIA:   General  PREOPERATIVE INDICATIONS:  AVAN GULLETT is a  57 y.o. male with a diagnosis of Left Foot Osteomyelitis Great Toe and 2nd Toe who failed conservative measures and elected for surgical management.    The risks benefits and alternatives were discussed with the patient preoperatively including but not limited to the risks of infection, bleeding, nerve injury, cardiopulmonary complications, the need for revision surgery, among others, and the patient was willing to proceed.  OPERATIVE IMPLANTS:   * No implants in log *  @ENCIMAGES @  OPERATIVE FINDINGS: Tissue margins were clear however there was clear fluid within the great toe MTP joint.  OPERATIVE PROCEDURE: Patient brought the operating room and underwent a general anesthetic.  After adequate levels anesthesia were obtained patient's left lower extremity was prepped using DuraPrep draped into a sterile field a timeout was called.  A fishmouth incision was made just distal to the MTP joint of the great toe and second toe.  The great toe and second toe amputated through the MTP joints.  There is fluid within the MTP joint as well as fluid around the flexor tendons.  The tendons were pulled cut and allowed to retract.  The wound was irrigated with Vashe.  Electrocautery was used hemostasis.  The wound bed was filled with 1 g vancomycin powder.  The incision closed using 2-0 nylon a sterile dressing was applied patient was extubated taken the PACU in stable condition.   DISCHARGE PLANNING:  Antibiotic duration: Preoperative antibiotics  Weightbearing: Touchdown weightbearing on the left  Pain medication:  Prescription for 10 mg Percocet  Dressing care/ Wound VAC: Dry dressing  Ambulatory devices: Crutches  Discharge to: Home.  Follow-up: In the office 1 week post operative.

## 2023-11-18 NOTE — Anesthesia Postprocedure Evaluation (Signed)
Anesthesia Post Note  Patient: [name]  Procedure(s) Performed: LEFT FOOT AMPUTATION GREAT TOE AND 2ND TOE (Left: Foot)     Patient location during evaluation: PACU Anesthesia Type: General Level of consciousness: awake and alert Pain management: pain level controlled Vital Signs Assessment: post-procedure vital signs reviewed and stable Respiratory status: spontaneous breathing, nonlabored ventilation and respiratory function stable Cardiovascular status: blood pressure returned to baseline and stable Postop Assessment: no apparent nausea or vomiting Anesthetic complications: no   No notable events documented.  Last Vitals:  Vitals:   11/18/23 0930 11/18/23 0945  BP: (!) 150/88 128/70  Pulse: 72 69  Resp: 12 12  Temp:  36.8 C  SpO2: 97% 96%    Last Pain:  Vitals:   11/18/23 0945  PainSc: 0-No pain                 Lowella Curb

## 2023-11-18 NOTE — Progress Notes (Signed)
Orthopedic Tech Progress Note Patient Details:  Blake Jackson Aug 07, 1966 [id number]  Ortho Devices Type of Ortho Device: Crutches, Postop shoe/boot Ortho Device/Splint Location: LLE Ortho Device/Splint Interventions: Application   Post Interventions Patient Tolerated: Well  Danise Dehne E Sabian Kuba 11/18/2023, 10:05 AM

## 2023-11-18 NOTE — Anesthesia Procedure Notes (Signed)
Procedure Name: LMA Insertion Date/Time: 11/18/2023 8:23 AM  Performed by: Halina Andreas, CRNAPatient Re-evaluated:Patient Re-evaluated prior to induction Oxygen Delivery Method: Circle system utilized Preoxygenation: Pre-oxygenation with 100% oxygen Induction Type: IV induction Ventilation: Mask ventilation without difficulty LMA: LMA inserted LMA Size: 4.0 Tube type: Oral

## 2023-11-18 NOTE — H&P (Signed)
Blake Jackson is an 58 y.o. male.   Chief Complaint: draining ulcer left foot HPI: Patient is a 58 year old gentleman who is seen urgently for initial evaluation for swelling ulceration of the great toe and second toe left foot. Patient states he is type II diabetic he states his most recent hemoglobin A1c is 5.9. Patient states he noticed the ulceration 5 months ago. He was started on doxycycline yesterday. He also received a intramuscular antibiotic injection.   Past Medical History:  Diagnosis Date   Anxiety    Arthritis    Asthma    Cellulitis of great toe, left    Diabetes mellitus without complication (HCC)    Hypertension    pt states he no longer has this   Spinal stenosis     Past Surgical History:  Procedure Laterality Date   HERNIA REPAIR     umbilical    History reviewed. No pertinent family history. Social History:  reports that he has never smoked. He has never used smokeless tobacco. He reports that he does not currently use alcohol. He reports that he does not use drugs.  Allergies: No Known Allergies  Medications Prior to Admission  Medication Sig Dispense Refill   albuterol (VENTOLIN HFA) 108 (90 Base) MCG/ACT inhaler Inhale 2 puffs into the lungs every 6 (six) hours as needed for wheezing or shortness of breath.     ALPRAZolam (XANAX) 1 MG tablet Take 0.5 mg by mouth 4 (four) times daily as needed for anxiety.     empagliflozin (JARDIANCE) 10 MG TABS tablet Take 10 mg by mouth daily.     ibuprofen (ADVIL) 200 MG tablet Take 400 mg by mouth every 6 (six) hours as needed for moderate pain (pain score 4-6).     metFORMIN (GLUCOPHAGE-XR) 500 MG 24 hr tablet Take 500 mg by mouth 2 (two) times daily.     morphine (AVINZA) 90 MG 24 hr capsule Take 90 mg by mouth 3 (three) times daily.     oxyCODONE-acetaminophen (PERCOCET) 7.5-325 MG tablet Take 1 tablet by mouth 5 (five) times daily as needed for severe pain (pain score 7-10).     pregabalin (LYRICA) 150 MG capsule  Take 150 mg by mouth 2 (two) times daily.      No results found for this or any previous visit (from the past 48 hours). No results found.  Review of Systems  All other systems reviewed and are negative.   Height 6' (1.829 m), weight 101.6 kg. Physical Exam  Patient is alert, oriented, no adenopathy, well-dressed, normal affect, normal respiratory effort. Examination patient has a strong palpable dorsalis pedis and posterior tibial pulse on the left.  He has sausage digit swelling of the great toe and second toe with cellulitis.  The ulcer beneath the great toe and second toe both probes to bone.  The ulcer on the great toe probes to multiple bone fragments.  Radiographs are consistent with chronic osteomyelitis. Assessment/Plan 1. Pain in left foot   2. Osteomyelitis of great toe of left foot (HCC)   3. Osteomyelitis of second toe of left foot (HCC)       Plan: Recommended proceeding with amputation of the great toe and second toe left foot.  Risks and benefits were discussed including risk of the wound not healing need for additional surgery.  Patient states he understands wished to proceed at this time he states he would like to proceed  Nadara Mustard, MD 11/18/2023, 6:52 AM

## 2023-11-18 NOTE — Transfer of Care (Signed)
Immediate Anesthesia Transfer of Care Note  Patient: Blake Jackson  Procedure(s) Performed: LEFT FOOT AMPUTATION GREAT TOE AND 2ND TOE (Left: Foot)  Patient Location: PACU  Anesthesia Type:General  Level of Consciousness: awake  Airway & Oxygen Therapy: Patient Spontanous Breathing  Post-op Assessment: Report given to RN  Post vital signs: stable  Last Vitals:  Vitals Value Taken Time  BP 148/81 11/18/23 0908  Temp    Pulse 78 11/18/23 0910  Resp 23 11/18/23 0910  SpO2 94 % 11/18/23 0910  Vitals shown include unfiled device data.  Last Pain:  Vitals:   11/18/23 0736  PainSc: 0-No pain         Complications: No notable events documented.

## 2023-11-18 NOTE — Anesthesia Preprocedure Evaluation (Signed)
Anesthesia Evaluation  Patient identified by MRN, date of birth, ID band Patient awake    Reviewed: Allergy & Precautions, H&P , NPO status , Patient's Chart, lab work & pertinent test results  Airway Mallampati: II  TM Distance: >3 FB Neck ROM: Full    Dental no notable dental hx.    Pulmonary asthma    Pulmonary exam normal breath sounds clear to auscultation       Cardiovascular hypertension, Pt. on medications negative cardio ROS Normal cardiovascular exam Rhythm:Regular Rate:Normal     Neuro/Psych   Anxiety     negative neurological ROS  negative psych ROS   GI/Hepatic negative GI ROS, Neg liver ROS,,,  Endo/Other  negative endocrine ROSdiabetes, Type 2    Renal/GU negative Renal ROS  negative genitourinary   Musculoskeletal  (+) Arthritis , Osteoarthritis,    Abdominal  (+) + obese  Peds negative pediatric ROS (+)  Hematology negative hematology ROS (+)   Anesthesia Other Findings   Reproductive/Obstetrics negative OB ROS                             Anesthesia Physical Anesthesia Plan  ASA: 3  Anesthesia Plan: General   Post-op Pain Management:    Induction: Intravenous  PONV Risk Score and Plan: 2 and Ondansetron, Midazolam and Treatment may vary due to age or medical condition  Airway Management Planned: LMA  Additional Equipment:   Intra-op Plan:   Post-operative Plan: Extubation in OR  Informed Consent: I have reviewed the patients History and Physical, chart, labs and discussed the procedure including the risks, benefits and alternatives for the proposed anesthesia with the patient or authorized representative who has indicated his/her understanding and acceptance.     Dental advisory given  Plan Discussed with: CRNA  Anesthesia Plan Comments:        Anesthesia Quick Evaluation

## 2023-11-19 ENCOUNTER — Encounter (HOSPITAL_COMMUNITY): Payer: Self-pay | Admitting: Orthopedic Surgery

## 2023-11-26 ENCOUNTER — Encounter: Payer: Managed Care, Other (non HMO) | Admitting: Orthopedic Surgery

## 2023-11-27 ENCOUNTER — Encounter: Payer: Self-pay | Admitting: Family

## 2023-11-27 ENCOUNTER — Ambulatory Visit (INDEPENDENT_AMBULATORY_CARE_PROVIDER_SITE_OTHER): Payer: Managed Care, Other (non HMO) | Admitting: Family

## 2023-11-27 DIAGNOSIS — M869 Osteomyelitis, unspecified: Secondary | ICD-10-CM

## 2023-11-27 NOTE — Progress Notes (Signed)
   Post-Op Visit Note   Patient: Blake Jackson           Date of Birth: 03-04-1966           MRN: [id number] Visit Date: 11/27/2023 PCP: Patient, No Pcp Per  Chief Complaint:  Chief Complaint  Patient presents with   Left Foot - Routine Post Op    11/18/23 left GT and 2nd toe amputation    HPI:  HPI Patient is a 58 year old gentleman who is seen status post left great and second toe amputations February 12. Ortho Exam On examination left foot his incision is well-approximated sutures there is no gaping or drainage no erythema  Visit Diagnoses: No diagnosis found.  Plan: Begin daily dose of cleansing.  Dry dressings.  Follow-up in 2 weeks for suture removal.  Follow-Up Instructions: Return in about 2 weeks (around 12/11/2023).   Imaging: No results found.  Orders:  No orders of the defined types were placed in this encounter.  No orders of the defined types were placed in this encounter.    PMFS History: Patient Active Problem List   Diagnosis Date Noted   Osteomyelitis of great toe of left foot (HCC) 11/18/2023   RHINITIS, CHRONIC 05/09/2009   ASTHMA 05/09/2009   BACK PAIN, CHRONIC 05/09/2009   Past Medical History:  Diagnosis Date   Anxiety    Arthritis    Asthma    Cellulitis of great toe, left    Diabetes mellitus without complication (HCC)    Hypertension    pt states he no longer has this   Spinal stenosis     History reviewed. No pertinent family history.  Past Surgical History:  Procedure Laterality Date   AMPUTATION Left 11/18/2023   Procedure: LEFT FOOT AMPUTATION GREAT TOE AND 2ND TOE;  Surgeon: Nadara Mustard, MD;  Location: [facility] OR;  Service: Orthopedics;  Laterality: Left;   HERNIA REPAIR     umbilical   Social History   Occupational History   Not on file  Tobacco Use   Smoking status: Never   Smokeless tobacco: Never  Vaping Use   Vaping status: Never Used  Substance and Sexual Activity   Alcohol use: Not Currently   Drug use:  Never   Sexual activity: Not on file

## 2023-12-07 ENCOUNTER — Ambulatory Visit (INDEPENDENT_AMBULATORY_CARE_PROVIDER_SITE_OTHER): Admitting: Orthopedic Surgery

## 2023-12-07 DIAGNOSIS — M869 Osteomyelitis, unspecified: Secondary | ICD-10-CM

## 2023-12-07 MED ORDER — DOXYCYCLINE HYCLATE 100 MG PO TABS: 100.0000 mg | ORAL_TABLET | Freq: Two times a day (BID) | ORAL | 0 refills | Status: DC

## 2023-12-08 ENCOUNTER — Encounter: Payer: Self-pay | Admitting: Orthopedic Surgery

## 2023-12-08 NOTE — Progress Notes (Signed)
 Office Visit Note   Patient: Blake Jackson           Date of Birth: [birth year]           MRN: [id number] Visit Date: 12/07/2023              Requested by: No referring provider defined for this encounter. PCP: Patient, No Pcp Per  Chief Complaint  Patient presents with   Left Foot - Routine Post Op    11/18/23 left GT and 2nd toe amputation      HPI: Patient is a 58 year old gentleman is 3 weeks status post left great toe and second toe amputation on February 12.  Assessment & Plan: Visit Diagnoses:  1. Osteomyelitis of great toe of left foot (HCC)   2. Osteomyelitis of second toe of left foot (HCC)     Plan: Recommended minimizing weightbearing prescription for doxycycline sutures harvested today.  Follow-Up Instructions: Return in about 2 weeks (around 12/21/2023).   Ortho Exam  Patient is alert, oriented, no adenopathy, well-dressed, normal affect, normal respiratory effort. Examination there is some mild redness there is no tenderness to palpation there is no drainage there is healthy granulation tissue at the incision.  Imaging: No results found. No images are attached to the encounter.  Labs: No results found for: "HGBA1C", "ESRSEDRATE", "CRP", "LABURIC", "REPTSTATUS", "GRAMSTAIN", "CULT", "LABORGA"   Lab Results  Component Value Date   ALBUMIN 4.8 11/02/2006    No results found for: "MG" No results found for: "VD25OH"  No results found for: "PREALBUMIN"    Latest Ref Rng & Units 11/18/2023    7:17 AM 11/02/2006   12:48 PM  CBC EXTENDED  WBC 4.0 - 10.5 K/uL 7.0  8.0   RBC 4.22 - 5.81 MIL/uL 3.63  4.35   Hemoglobin 13.0 - 17.0 g/dL 78.2  95.6   HCT 21.3 - 52.0 % 34.5  40.1   Platelets 150 - 400 K/uL 319  252   NEUT# 1.4 - 7.7 K/uL  4.9      There is no height or weight on file to calculate BMI.  Orders:  No orders of the defined types were placed in this encounter.  Meds ordered this encounter  Medications   doxycycline (VIBRA-TABS) 100  MG tablet    Sig: Take 1 tablet (100 mg total) by mouth 2 (two) times daily.    Dispense:  30 tablet    Refill:  0     Procedures: No procedures performed  Clinical Data: No additional findings.  ROS:  All other systems negative, except as noted in the HPI. Review of Systems  Objective: Vital Signs: There were no vitals taken for this visit.  Specialty Comments:  No specialty comments available.  PMFS History: Patient Active Problem List   Diagnosis Date Noted   Osteomyelitis of great toe of left foot (HCC) 11/18/2023   RHINITIS, CHRONIC 05/09/2009   ASTHMA 05/09/2009   BACK PAIN, CHRONIC 05/09/2009   Past Medical History:  Diagnosis Date   Anxiety    Arthritis    Asthma    Cellulitis of great toe, left    Diabetes mellitus without complication (HCC)    Hypertension    pt states he no longer has this   Spinal stenosis     History reviewed. No pertinent family history.  Past Surgical History:  Procedure Laterality Date   AMPUTATION Left 11/18/2023   Procedure: LEFT FOOT AMPUTATION GREAT TOE AND 2ND TOE;  Surgeon: Aldean Baker  V, MD;  Location: [facility] OR;  Service: Orthopedics;  Laterality: Left;   HERNIA REPAIR     umbilical   Social History   Occupational History   Not on file  Tobacco Use   Smoking status: Never   Smokeless tobacco: Never  Vaping Use   Vaping status: Never Used  Substance and Sexual Activity   Alcohol use: Not Currently   Drug use: Never   Sexual activity: Not on file

## 2023-12-14 ENCOUNTER — Encounter: Payer: Self-pay | Admitting: Orthopedic Surgery

## 2023-12-14 ENCOUNTER — Other Ambulatory Visit (INDEPENDENT_AMBULATORY_CARE_PROVIDER_SITE_OTHER): Payer: Self-pay

## 2023-12-14 ENCOUNTER — Ambulatory Visit (INDEPENDENT_AMBULATORY_CARE_PROVIDER_SITE_OTHER): Admitting: Orthopedic Surgery

## 2023-12-14 DIAGNOSIS — M79671 Pain in right foot: Secondary | ICD-10-CM

## 2023-12-14 DIAGNOSIS — M869 Osteomyelitis, unspecified: Secondary | ICD-10-CM

## 2023-12-14 MED ORDER — SULFAMETHOXAZOLE-TRIMETHOPRIM 800-160 MG PO TABS: 1.0000 | ORAL_TABLET | Freq: Two times a day (BID) | ORAL | 0 refills | Status: DC

## 2023-12-14 NOTE — Progress Notes (Signed)
 Office Visit Note   Patient: Blake Jackson           Date of Birth: 1966/04/26           MRN: [id number] Visit Date: 12/14/2023              Requested by: No referring provider defined for this encounter. PCP: Patient, No Pcp Per  Chief Complaint  Patient presents with   Left Foot - Routine Post Op    11/18/23 left GT and 2nd toe amputation      HPI: Patient is a 58 year old gentleman status post left great toe and second toe amputation on February 12.  Patient states he has been back at work and on his feet.  Patient is completing a course of doxycycline.  Patient states he is increased swelling of the left foot and ulceration to the right great toe.  Assessment & Plan: Visit Diagnoses:  1. Pain in right foot   2. Osteomyelitis of great toe of right foot (HCC)     Plan: Recommended decreasing weightbearing.  We will take him out of work for several weeks.  Discussed that most likely we would need to proceed with amputation of the right great toe.  Will call in a prescription for Septra DS.  Follow-Up Instructions: Return in about 1 week (around 12/21/2023).   Ortho Exam  Patient is alert, oriented, no adenopathy, well-dressed, normal affect, normal respiratory effort. Examination there is swelling and redness around the left forefoot however is not tender to palpation.  There is slight wound dehiscence from swelling.  Examination the right foot there is an ulcer on the plantar aspect of the IP joint with sausage digit swelling consistent with chronic osteomyelitis of the great toe.  Imaging: XR Foot Complete Right Result Date: 12/14/2023 2 view radiographs of the right foot shows early destructive changes of the IP joint of the right great toe.  Consistent with osteomyelitis.  No images are attached to the encounter.  Labs: No results found for: "HGBA1C", "ESRSEDRATE", "CRP", "LABURIC", "REPTSTATUS", "GRAMSTAIN", "CULT", "LABORGA"   Lab Results  Component Value  Date   ALBUMIN 4.8 11/02/2006    No results found for: "MG" No results found for: "VD25OH"  No results found for: "PREALBUMIN"    Latest Ref Rng & Units 11/18/2023    7:17 AM 11/02/2006   12:48 PM  CBC EXTENDED  WBC 4.0 - 10.5 K/uL 7.0  8.0   RBC 4.22 - 5.81 MIL/uL 3.63  4.35   Hemoglobin 13.0 - 17.0 g/dL 40.9  81.1   HCT 91.4 - 52.0 % 34.5  40.1   Platelets 150 - 400 K/uL 319  252   NEUT# 1.4 - 7.7 K/uL  4.9      There is no height or weight on file to calculate BMI.  Orders:  Orders Placed This Encounter  Procedures   XR Foot Complete Right   Meds ordered this encounter  Medications   sulfamethoxazole-trimethoprim (BACTRIM DS) 800-160 MG tablet    Sig: Take 1 tablet by mouth 2 (two) times daily.    Dispense:  20 tablet    Refill:  0     Procedures: No procedures performed  Clinical Data: No additional findings.  ROS:  All other systems negative, except as noted in the HPI. Review of Systems  Objective: Vital Signs: There were no vitals taken for this visit.  Specialty Comments:  No specialty comments available.  PMFS History: Patient Active Problem List  Diagnosis Date Noted   Osteomyelitis of great toe of left foot (HCC) 11/18/2023   RHINITIS, CHRONIC 05/09/2009   ASTHMA 05/09/2009   BACK PAIN, CHRONIC 05/09/2009   Past Medical History:  Diagnosis Date   Anxiety    Arthritis    Asthma    Cellulitis of great toe, left    Diabetes mellitus without complication (HCC)    Hypertension    pt states he no longer has this   Spinal stenosis     History reviewed. No pertinent family history.  Past Surgical History:  Procedure Laterality Date   AMPUTATION Left 11/18/2023   Procedure: LEFT FOOT AMPUTATION GREAT TOE AND 2ND TOE;  Surgeon: Nadara Mustard, MD;  Location: [facility] OR;  Service: Orthopedics;  Laterality: Left;   HERNIA REPAIR     umbilical   Social History   Occupational History   Not on file  Tobacco Use   Smoking status: Never    Smokeless tobacco: Never  Vaping Use   Vaping status: Never Used  Substance and Sexual Activity   Alcohol use: Not Currently   Drug use: Never   Sexual activity: Not on file

## 2023-12-21 ENCOUNTER — Ambulatory Visit (INDEPENDENT_AMBULATORY_CARE_PROVIDER_SITE_OTHER): Admitting: Orthopedic Surgery

## 2023-12-21 DIAGNOSIS — M869 Osteomyelitis, unspecified: Secondary | ICD-10-CM

## 2023-12-22 ENCOUNTER — Encounter: Payer: Self-pay | Admitting: Orthopedic Surgery

## 2023-12-22 NOTE — Progress Notes (Signed)
 Office Visit Note   Patient: Blake Jackson           Date of Birth: 19-Feb-1966           MRN: [id number] Visit Date: 12/21/2023              Requested by: No referring provider defined for this encounter. PCP: Patient, No Pcp Per  Chief Complaint  Patient presents with   Left Foot - Routine Post Op    11/18/23 left GT and 2nd toe amputation      HPI: Patient is a 58 year old gentleman who is 4 weeks status post left great toe and second toe amputation.  Patient has been on Septra DS.  Patient states the redness is resolving.  Assessment & Plan: Visit Diagnoses:  1. Osteomyelitis of second toe of left foot (HCC)   2. Osteomyelitis of great toe of left foot (HCC)     Plan: Recommended to continue Dial soap cleansing dry dressing changes minimize weightbearing.  Discussed the importance of diabetic control to improve microcirculation.  Follow-Up Instructions: Return in about 2 weeks (around 01/04/2024).   Ortho Exam  Patient is alert, oriented, no adenopathy, well-dressed, normal affect, normal respiratory effort. Examination patient has a palpable dorsalis pedis pulse the cellulitis and swelling is resolving.  There is mild wound dehiscence.  No hemoglobin A1c on record.  Imaging: No results found. No images are attached to the encounter.  Labs: No results found for: "HGBA1C", "ESRSEDRATE", "CRP", "LABURIC", "REPTSTATUS", "GRAMSTAIN", "CULT", "LABORGA"   Lab Results  Component Value Date   ALBUMIN 4.8 11/02/2006    No results found for: "MG" No results found for: "VD25OH"  No results found for: "PREALBUMIN"    Latest Ref Rng & Units 11/18/2023    7:17 AM 11/02/2006   12:48 PM  CBC EXTENDED  WBC 4.0 - 10.5 K/uL 7.0  8.0   RBC 4.22 - 5.81 MIL/uL 3.63  4.35   Hemoglobin 13.0 - 17.0 g/dL 40.9  81.1   HCT 91.4 - 52.0 % 34.5  40.1   Platelets 150 - 400 K/uL 319  252   NEUT# 1.4 - 7.7 K/uL  4.9      There is no height or weight on file to calculate  BMI.  Orders:  No orders of the defined types were placed in this encounter.  No orders of the defined types were placed in this encounter.    Procedures: No procedures performed  Clinical Data: No additional findings.  ROS:  All other systems negative, except as noted in the HPI. Review of Systems  Objective: Vital Signs: There were no vitals taken for this visit.  Specialty Comments:  No specialty comments available.  PMFS History: Patient Active Problem List   Diagnosis Date Noted   Osteomyelitis of great toe of left foot (HCC) 11/18/2023   RHINITIS, CHRONIC 05/09/2009   ASTHMA 05/09/2009   BACK PAIN, CHRONIC 05/09/2009   Past Medical History:  Diagnosis Date   Anxiety    Arthritis    Asthma    Cellulitis of great toe, left    Diabetes mellitus without complication (HCC)    Hypertension    pt states he no longer has this   Spinal stenosis     History reviewed. No pertinent family history.  Past Surgical History:  Procedure Laterality Date   AMPUTATION Left 11/18/2023   Procedure: LEFT FOOT AMPUTATION GREAT TOE AND 2ND TOE;  Surgeon: Nadara Mustard, MD;  Location: [facility] OR;  Service: Orthopedics;  Laterality: Left;   HERNIA REPAIR     umbilical   Social History   Occupational History   Not on file  Tobacco Use   Smoking status: Never   Smokeless tobacco: Never  Vaping Use   Vaping status: Never Used  Substance and Sexual Activity   Alcohol use: Not Currently   Drug use: Never   Sexual activity: Not on file

## 2023-12-23 ENCOUNTER — Other Ambulatory Visit: Payer: Self-pay | Admitting: Orthopedic Surgery

## 2023-12-23 ENCOUNTER — Encounter: Payer: Self-pay | Admitting: Orthopedic Surgery

## 2023-12-23 MED ORDER — SULFAMETHOXAZOLE-TRIMETHOPRIM 800-160 MG PO TABS: 1.0000 | ORAL_TABLET | Freq: Two times a day (BID) | ORAL | 0 refills | Status: DC

## 2024-01-05 ENCOUNTER — Ambulatory Visit (INDEPENDENT_AMBULATORY_CARE_PROVIDER_SITE_OTHER): Admitting: Orthopedic Surgery

## 2024-01-05 DIAGNOSIS — M869 Osteomyelitis, unspecified: Secondary | ICD-10-CM

## 2024-01-07 ENCOUNTER — Encounter: Payer: Self-pay | Admitting: Orthopedic Surgery

## 2024-01-07 NOTE — Progress Notes (Signed)
 Office Visit Note   Patient: Blake Jackson           Date of Birth: Nov 21, 1965           MRN: [id number] Visit Date: 01/05/2024              Requested by: No referring provider defined for this encounter. PCP: Patient, No Pcp Per  Chief Complaint  Patient presents with   Left Foot - Routine Post Op    11/18/23 left GT and 2nd toe amputation      HPI: Patient is a 58 year old gentleman who is status post left great toe and second toe amputation February 12.  Assessment & Plan: Visit Diagnoses:  1. Osteomyelitis of great toe of left foot (HCC)   2. Osteomyelitis of second toe of left foot (HCC)     Plan: Discussed with the patient he still needs to be on protected weightbearing still needs to be out of work for at least the next 4 weeks.  Follow-Up Instructions: Return in about 4 weeks (around 02/02/2024).   Ortho Exam  Patient is alert, oriented, no adenopathy, well-dressed, normal affect, normal respiratory effort. Examination there is granulation tissue over the residual amputation of the left great toe and second toe.  There is decreased swelling decreased redness.  There is no drainage.   There is a Wagner grade 1 ulcer right great toe IP joint which is 1 x 2 cm with healthy granulation tissue.  Patient has good pulses.  Imaging: No results found. No images are attached to the encounter.  Labs: No results found for: "HGBA1C", "ESRSEDRATE", "CRP", "LABURIC", "REPTSTATUS", "GRAMSTAIN", "CULT", "LABORGA"   Lab Results  Component Value Date   ALBUMIN 4.8 11/02/2006    No results found for: "MG" No results found for: "VD25OH"  No results found for: "PREALBUMIN"    Latest Ref Rng & Units 11/18/2023    7:17 AM 11/02/2006   12:48 PM  CBC EXTENDED  WBC 4.0 - 10.5 K/uL 7.0  8.0   RBC 4.22 - 5.81 MIL/uL 3.63  4.35   Hemoglobin 13.0 - 17.0 g/dL 40.9  81.1   HCT 91.4 - 52.0 % 34.5  40.1   Platelets 150 - 400 K/uL 319  252   NEUT# 1.4 - 7.7 K/uL  4.9       There is no height or weight on file to calculate BMI.  Orders:  No orders of the defined types were placed in this encounter.  No orders of the defined types were placed in this encounter.    Procedures: No procedures performed  Clinical Data: No additional findings.  ROS:  All other systems negative, except as noted in the HPI. Review of Systems  Objective: Vital Signs: There were no vitals taken for this visit.  Specialty Comments:  No specialty comments available.  PMFS History: Patient Active Problem List   Diagnosis Date Noted   Osteomyelitis of great toe of left foot (HCC) 11/18/2023   RHINITIS, CHRONIC 05/09/2009   ASTHMA 05/09/2009   BACK PAIN, CHRONIC 05/09/2009   Past Medical History:  Diagnosis Date   Anxiety    Arthritis    Asthma    Cellulitis of great toe, left    Diabetes mellitus without complication (HCC)    Hypertension    pt states he no longer has this   Spinal stenosis     History reviewed. No pertinent family history.  Past Surgical History:  Procedure Laterality Date   AMPUTATION  Left 11/18/2023   Procedure: LEFT FOOT AMPUTATION GREAT TOE AND 2ND TOE;  Surgeon: Nadara Mustard, MD;  Location: [facility] OR;  Service: Orthopedics;  Laterality: Left;   HERNIA REPAIR     umbilical   Social History   Occupational History   Not on file  Tobacco Use   Smoking status: Never   Smokeless tobacco: Never  Vaping Use   Vaping status: Never Used  Substance and Sexual Activity   Alcohol use: Not Currently   Drug use: Never   Sexual activity: Not on file

## 2024-01-13 ENCOUNTER — Telehealth: Payer: Self-pay | Admitting: Orthopedic Surgery

## 2024-01-13 NOTE — Telephone Encounter (Signed)
 Pt submitted short term forms, medical release form, and 20.00 cash payment. Accepted 01/13/24

## 2024-01-13 NOTE — Telephone Encounter (Signed)
 Received

## 2024-01-23 ENCOUNTER — Other Ambulatory Visit: Payer: Self-pay | Admitting: Family

## 2024-01-29 ENCOUNTER — Ambulatory Visit (INDEPENDENT_AMBULATORY_CARE_PROVIDER_SITE_OTHER): Admitting: Family

## 2024-01-29 DIAGNOSIS — M869 Osteomyelitis, unspecified: Secondary | ICD-10-CM

## 2024-01-29 DIAGNOSIS — L97521 Non-pressure chronic ulcer of other part of left foot limited to breakdown of skin: Secondary | ICD-10-CM | POA: Diagnosis not present

## 2024-02-02 ENCOUNTER — Encounter: Admitting: Orthopedic Surgery

## 2024-02-03 ENCOUNTER — Encounter: Payer: Self-pay | Admitting: Family

## 2024-02-03 NOTE — Progress Notes (Signed)
   Post-Op Visit Note   Patient: Blake Jackson           Date of Birth: 19-Dec-1965           MRN: [id number] Visit Date: 01/29/2024 PCP: Patient, No Pcp Per  Chief Complaint:  Chief Complaint  Patient presents with   Left Foot - Routine Post Op    11/18/23 left GT and 2nd toe amputation    HPI:  HPI The patient is a 58 year old gentleman who is status post left great toe and second toe amputation February 12 of this year.  Has been on a course of Bactrim , this started April 21. Ortho Exam On examination left foot the left great toe and second toe amputation there is an area of proud granulation tissue.  This does not probe to bone.  He has had intermittent bleeding from the site it is improved from a week ago.  There is mild erythema without ascending cellulitis over the dorsum of the foot  Visit Diagnoses: No diagnosis found.  Plan: Resolving cellulitis.  Complete course of Bactrim  Daily dose of cleansing close monitoring of antibacterial ointment dressing changes.  Plan to follow-up in 2 weeks  Follow-Up Instructions: Return in about 2 weeks (around 02/12/2024).   Imaging: No results found.  Orders:  No orders of the defined types were placed in this encounter.  No orders of the defined types were placed in this encounter.    PMFS History: Patient Active Problem List   Diagnosis Date Noted   Osteomyelitis of great toe of left foot (HCC) 11/18/2023   RHINITIS, CHRONIC 05/09/2009   ASTHMA 05/09/2009   BACK PAIN, CHRONIC 05/09/2009   Past Medical History:  Diagnosis Date   Anxiety    Arthritis    Asthma    Cellulitis of great toe, left    Diabetes mellitus without complication (HCC)    Hypertension    pt states he no longer has this   Spinal stenosis     History reviewed. No pertinent family history.  Past Surgical History:  Procedure Laterality Date   AMPUTATION Left 11/18/2023   Procedure: LEFT FOOT AMPUTATION GREAT TOE AND 2ND TOE;  Surgeon: Timothy Ford, MD;  Location: [facility] OR;  Service: Orthopedics;  Laterality: Left;   HERNIA REPAIR     umbilical   Social History   Occupational History   Not on file  Tobacco Use   Smoking status: Never   Smokeless tobacco: Never  Vaping Use   Vaping status: Never Used  Substance and Sexual Activity   Alcohol use: Not Currently   Drug use: Never   Sexual activity: Not on file

## 2024-02-05 ENCOUNTER — Encounter: Admitting: Family

## 2024-02-10 ENCOUNTER — Other Ambulatory Visit (INDEPENDENT_AMBULATORY_CARE_PROVIDER_SITE_OTHER): Payer: Self-pay

## 2024-02-10 ENCOUNTER — Encounter: Payer: Self-pay | Admitting: Family

## 2024-02-10 ENCOUNTER — Ambulatory Visit (INDEPENDENT_AMBULATORY_CARE_PROVIDER_SITE_OTHER): Admitting: Family

## 2024-02-10 DIAGNOSIS — L97521 Non-pressure chronic ulcer of other part of left foot limited to breakdown of skin: Secondary | ICD-10-CM | POA: Diagnosis not present

## 2024-02-10 MED ORDER — SULFAMETHOXAZOLE-TRIMETHOPRIM 800-160 MG PO TABS: 1.0000 | ORAL_TABLET | Freq: Two times a day (BID) | ORAL | 0 refills | Status: DC

## 2024-02-10 NOTE — Progress Notes (Signed)
   Post-Op Visit Note   Patient: Blake Jackson           Date of Birth: [birth year]           MRN: [id number] Visit Date: 02/10/2024 PCP: Patient, No Pcp Per  Chief Complaint:  Chief Complaint  Patient presents with   Left Foot - Routine Post Op    11/18/23 left GT and 2nd toe amputation    HPI:  HPI The patient is a 58 year old gentleman seen status post left great toe and second toe amputation February 12.  He reports this had been previously fully healed then about 2 weeks ago had a ulcer opened up over the second metatarsal head distally this initially was proud tissue with significant serosanguineous drainage.  He has completed a course of Bactrim .  He initially saw full resolution of his cellulitis but over the last 2 days has had return of swelling and mild erythema.  denies fevers or chills. Ortho Exam On examination left foot over the left great toe second toe amputation site there is 1 open area at this measures 15 mm x 5 mm there are 2 mm of depth this is filled in with 50% granulation tissue there is no surrounding erythema no epiboly no purulence there is mild erythema mild warmth over the forefoot  Visit Diagnoses: No diagnosis found.  Plan: Concern for returning cellulitis will place him on a course of Bactrim  for 3 weeks discussed using probiotics.  Radiographs were reassuring.  Follow-Up Instructions: No follow-ups on file.   Imaging: No results found.  Orders:  No orders of the defined types were placed in this encounter.  No orders of the defined types were placed in this encounter.    PMFS History: Patient Active Problem List   Diagnosis Date Noted   Osteomyelitis of great toe of left foot (HCC) 11/18/2023   RHINITIS, CHRONIC 05/09/2009   ASTHMA 05/09/2009   BACK PAIN, CHRONIC 05/09/2009   Past Medical History:  Diagnosis Date   Anxiety    Arthritis    Asthma    Cellulitis of great toe, left    Diabetes mellitus without complication (HCC)     Hypertension    pt states he no longer has this   Spinal stenosis     History reviewed. No pertinent family history.  Past Surgical History:  Procedure Laterality Date   AMPUTATION Left 11/18/2023   Procedure: LEFT FOOT AMPUTATION GREAT TOE AND 2ND TOE;  Surgeon: Timothy Ford, MD;  Location: [facility] OR;  Service: Orthopedics;  Laterality: Left;   HERNIA REPAIR     umbilical   Social History   Occupational History   Not on file  Tobacco Use   Smoking status: Never   Smokeless tobacco: Never  Vaping Use   Vaping status: Never Used  Substance and Sexual Activity   Alcohol use: Not Currently   Drug use: Never   Sexual activity: Not on file

## 2024-02-11 ENCOUNTER — Telehealth: Payer: Self-pay | Admitting: Family

## 2024-02-11 NOTE — Telephone Encounter (Signed)
 Pt request a out of work note

## 2024-02-24 ENCOUNTER — Encounter: Payer: Self-pay | Admitting: Family

## 2024-02-24 ENCOUNTER — Ambulatory Visit (INDEPENDENT_AMBULATORY_CARE_PROVIDER_SITE_OTHER): Admitting: Family

## 2024-02-24 DIAGNOSIS — M869 Osteomyelitis, unspecified: Secondary | ICD-10-CM

## 2024-02-24 DIAGNOSIS — M86271 Subacute osteomyelitis, right ankle and foot: Secondary | ICD-10-CM | POA: Diagnosis not present

## 2024-02-24 MED ORDER — SULFAMETHOXAZOLE-TRIMETHOPRIM 800-160 MG PO TABS: 1.0000 | ORAL_TABLET | Freq: Two times a day (BID) | ORAL | 0 refills | Status: DC

## 2024-02-24 NOTE — Progress Notes (Signed)
 Office Visit Note   Patient: Blake Jackson           Date of Birth: May 22, 1966           MRN: [id number] Visit Date: 02/24/2024              Requested by: No referring provider defined for this encounter. PCP: Patient, No Pcp Per  No chief complaint on file.     HPI: The patient is a 58 year old gentleman who is seen in follow-up for ulceration to his left foot.  He is status post left great toe and second toe amputation February 12 of this year this initially healed unfortunately about a month ago he had opening up along his incision with proud granulation and heavy drainage  Has completed a course of Bactrim  and began a second course without any improvement he has actually had increased drainage over the last week  Assessment & Plan: Visit Diagnoses: No diagnosis found.  Plan: Osteomyelitis left foot with exposed bone today.  Discussed need for further limb salvage surgery with the patient.  He is in agreement the plan.  He is not ready to proceed this week.  We will place him on oral antibiotics and plan for surgery next week  Follow-Up Instructions: No follow-ups on file.   Ortho Exam  Patient is alert, oriented, no adenopathy, well-dressed, normal affect, normal respiratory effort. On examination left foot over the second ray area of the incision open ulceration which is dime size now probes down to bone there is no purulence there is mild surrounding erythema no ascending cellulitis.    He does have a palpable dorsalis pedis pulse.  Imaging: No results found. No images are attached to the encounter.  Labs: No results found for: "HGBA1C", "ESRSEDRATE", "CRP", "LABURIC", "REPTSTATUS", "GRAMSTAIN", "CULT", "LABORGA"   Lab Results  Component Value Date   ALBUMIN 4.8 11/02/2006    No results found for: "MG" No results found for: "VD25OH"  No results found for: "PREALBUMIN"    Latest Ref Rng & Units 11/18/2023    7:17 AM 11/02/2006   12:48 PM  CBC EXTENDED   WBC 4.0 - 10.5 K/uL 7.0  8.0   RBC 4.22 - 5.81 MIL/uL 3.63  4.35   Hemoglobin 13.0 - 17.0 g/dL 40.9  81.1   HCT 91.4 - 52.0 % 34.5  40.1   Platelets 150 - 400 K/uL 319  252   NEUT# 1.4 - 7.7 K/uL  4.9      There is no height or weight on file to calculate BMI.  Orders:  No orders of the defined types were placed in this encounter.  No orders of the defined types were placed in this encounter.    Procedures: No procedures performed  Clinical Data: No additional findings.  ROS:  All other systems negative, except as noted in the HPI. Review of Systems  Objective: Vital Signs: There were no vitals taken for this visit.  Specialty Comments:  No specialty comments available.  PMFS History: Patient Active Problem List   Diagnosis Date Noted   Osteomyelitis of great toe of left foot (HCC) 11/18/2023   RHINITIS, CHRONIC 05/09/2009   ASTHMA 05/09/2009   BACK PAIN, CHRONIC 05/09/2009   Past Medical History:  Diagnosis Date   Anxiety    Arthritis    Asthma    Cellulitis of great toe, left    Diabetes mellitus without complication (HCC)    Hypertension    pt states he no  longer has this   Spinal stenosis     History reviewed. No pertinent family history.  Past Surgical History:  Procedure Laterality Date   AMPUTATION Left 11/18/2023   Procedure: LEFT FOOT AMPUTATION GREAT TOE AND 2ND TOE;  Surgeon: Timothy Ford, MD;  Location: [facility] OR;  Service: Orthopedics;  Laterality: Left;   HERNIA REPAIR     umbilical   Social History   Occupational History   Not on file  Tobacco Use   Smoking status: Never   Smokeless tobacco: Never  Vaping Use   Vaping status: Never Used  Substance and Sexual Activity   Alcohol use: Not Currently   Drug use: Never   Sexual activity: Not on file

## 2024-03-01 ENCOUNTER — Other Ambulatory Visit: Payer: Self-pay | Admitting: Orthopedic Surgery

## 2024-03-09 ENCOUNTER — Encounter: Payer: Self-pay | Admitting: Family

## 2024-03-09 ENCOUNTER — Ambulatory Visit (INDEPENDENT_AMBULATORY_CARE_PROVIDER_SITE_OTHER): Admitting: Family

## 2024-03-09 ENCOUNTER — Telehealth: Payer: Self-pay | Admitting: Family

## 2024-03-09 DIAGNOSIS — M86272 Subacute osteomyelitis, left ankle and foot: Secondary | ICD-10-CM

## 2024-03-09 MED ORDER — DOXYCYCLINE HYCLATE 100 MG PO TABS: 100.0000 mg | ORAL_TABLET | Freq: Two times a day (BID) | ORAL | 0 refills | Status: DC

## 2024-03-09 NOTE — Telephone Encounter (Signed)
 02/24/24 ov note & oow note faxed to [facility] [phone]

## 2024-03-09 NOTE — Progress Notes (Signed)
 Office Visit Note   Patient: Blake Jackson           Date of Birth: 05/13/1966           MRN: [id number] Visit Date: 03/09/2024              Requested by: No referring provider defined for this encounter. PCP: Patient, No Pcp Per  Chief Complaint  Patient presents with   Left Foot - Follow-up      HPI: The patient is a 58 year old gentleman seen in follow-up for ulceration left foot along his incision line which had previously been healed this currently has been having copious drainage he has recently completed a course of Bactrim   Assessment & Plan: Visit Diagnoses: No diagnosis found.  Plan: Osteomyelitis left foot discussed transmetatarsal amputation again.  Patient reports he will be ready to proceed with surgery in about 2 weeks time.  Will place him back on oral antibiotics until time of surgery  Follow-Up Instructions: Return post op.   Ortho Exam  Patient is alert, oriented, no adenopathy, well-dressed, normal affect, normal respiratory effort. On examination left foot over the second ray amputation there is open ulcer which is 5 mm in diameter this probes down to bone there is serous drainage mild surrounding erythema there is no ascending cellulitis today.    Imaging: No results found. No images are attached to the encounter.  Labs: No results found for: "HGBA1C", "ESRSEDRATE", "CRP", "LABURIC", "REPTSTATUS", "GRAMSTAIN", "CULT", "LABORGA"   Lab Results  Component Value Date   ALBUMIN 4.8 11/02/2006    No results found for: "MG" No results found for: "VD25OH"  No results found for: "PREALBUMIN"    Latest Ref Rng & Units 11/18/2023    7:17 AM 11/02/2006   12:48 PM  CBC EXTENDED  WBC 4.0 - 10.5 K/uL 7.0  8.0   RBC 4.22 - 5.81 MIL/uL 3.63  4.35   Hemoglobin 13.0 - 17.0 g/dL 40.9  81.1   HCT 91.4 - 52.0 % 34.5  40.1   Platelets 150 - 400 K/uL 319  252   NEUT# 1.4 - 7.7 K/uL  4.9      There is no height or weight on file to calculate  BMI.  Orders:  No orders of the defined types were placed in this encounter.  No orders of the defined types were placed in this encounter.    Procedures: No procedures performed  Clinical Data: No additional findings.  ROS:  All other systems negative, except as noted in the HPI. Review of Systems  Objective: Vital Signs: There were no vitals taken for this visit.  Specialty Comments:  No specialty comments available.  PMFS History: Patient Active Problem List   Diagnosis Date Noted   Osteomyelitis of great toe of left foot (HCC) 11/18/2023   RHINITIS, CHRONIC 05/09/2009   ASTHMA 05/09/2009   BACK PAIN, CHRONIC 05/09/2009   Past Medical History:  Diagnosis Date   Anxiety    Arthritis    Asthma    Cellulitis of great toe, left    Diabetes mellitus without complication (HCC)    Hypertension    pt states he no longer has this   Spinal stenosis     History reviewed. No pertinent family history.  Past Surgical History:  Procedure Laterality Date   AMPUTATION Left 11/18/2023   Procedure: LEFT FOOT AMPUTATION GREAT TOE AND 2ND TOE;  Surgeon: Timothy Ford, MD;  Location: [facility] OR;  Service: Orthopedics;  Laterality:  Left;   HERNIA REPAIR     umbilical   Social History   Occupational History   Not on file  Tobacco Use   Smoking status: Never   Smokeless tobacco: Never  Vaping Use   Vaping status: Never Used  Substance and Sexual Activity   Alcohol use: Not Currently   Drug use: Never   Sexual activity: Not on file

## 2024-03-23 ENCOUNTER — Encounter (HOSPITAL_COMMUNITY): Payer: Self-pay | Admitting: Orthopedic Surgery

## 2024-03-23 ENCOUNTER — Other Ambulatory Visit: Payer: Self-pay

## 2024-03-23 NOTE — Progress Notes (Signed)
 SDW CALL  Patient was given pre-op instructions over the phone. The opportunity was given for the patient to ask questions. No further questions asked. Patient verbalized understanding of instructions given.   PCP - Suan Elm, MD  Cardiologist -   PPM/ICD - denies Device Orders - n/a Rep Notified - n/a  Chest x-ray - denies EKG - 11-18-23 Stress Test - denies ECHO - denies Cardiac Cath - denies  Sleep Study - denies CPAP - n/a  Fasting Blood Sugar -  Per patient does not check blood sugar. MD monitors A1c. Per patient last A1c 2 weeks ago was 5.5   Last dose of GLP1 agonist-  denies GLP1 instructions: n/a  Blood Thinner Instructions:denies Aspirin Instructions:n/a  ERAS Protcol -clear liquids until 6:30 am. PRE-SURGERY Ensure or G2-   COVID TEST- n/a   Anesthesia review: no  Patient denies shortness of breath, fever, cough and chest pain over the phone call   All instructions explained to the patient, with a verbal understanding of the material. Patient agrees to go over the instructions while at home for a better understanding.

## 2024-03-23 NOTE — H&P (Signed)
 Blake Jackson is an 58 y.o. male.   Chief Complaint:  HPI: 58 y/o male with history of left GT and second toe amputations 11/18/23 due to osteomyelitis.  He has developed cellulitis.  He has developed ulcers along the incision site.  Has completed a course of Bactrim  and began a second course without any improvement he has actually had increased drainage over the last week.     Past Medical History:  Diagnosis Date   Anxiety    Arthritis    Asthma    Cellulitis of great toe, left    Diabetes mellitus without complication (HCC)    Hypertension    pt states he no longer has this   Spinal stenosis     Past Surgical History:  Procedure Laterality Date   AMPUTATION Left 11/18/2023   Procedure: LEFT FOOT AMPUTATION GREAT TOE AND 2ND TOE;  Surgeon: Timothy Ford, MD;  Location: [facility] OR;  Service: Orthopedics;  Laterality: Left;   HERNIA REPAIR     umbilical    No family history on file. Social History:  reports that he has never smoked. He has never used smokeless tobacco. He reports that he does not currently use alcohol. He reports that he does not use drugs.  Allergies: No Known Allergies  No medications prior to admission.    No results found for this or any previous visit (from the past 48 hours). No results found.  Review of Systems  All other systems reviewed and are negative.   There were no vitals taken for this visit. Physical Exam  Patient is alert, oriented, no adenopathy, well-dressed, normal affect, normal respiratory effort. On examination left foot over the second ray amputation there is open ulcer which is 5 mm in diameter this probes down to bone there is serous drainage mild surrounding erythema there is no ascending cellulitis today. Palpable DP pulses.      Assessment/Plan Non healing amputation site GT and second toe with Osteomyelitis left foot with exposed bone.  He has failed conservative  treatment with oral antibiotics and wound care.  Plan is for  revision and transmetatarsal amputation of thr left foot with Dr. Julio Ohm on 03/25/24.  Rocky Cipro, PA-C 03/23/2024, 9:37 AM

## 2024-03-24 NOTE — Progress Notes (Signed)
 Patient notified of surgical time change on 03/25/2024. Patient to arrive at 0800. Patient verbalized understanding. No further questions asked.

## 2024-03-25 ENCOUNTER — Ambulatory Visit (HOSPITAL_COMMUNITY): Admitting: Anesthesiology

## 2024-03-25 ENCOUNTER — Other Ambulatory Visit: Payer: Self-pay

## 2024-03-25 ENCOUNTER — Encounter (HOSPITAL_COMMUNITY): Payer: Self-pay | Admitting: Orthopedic Surgery

## 2024-03-25 ENCOUNTER — Ambulatory Visit (HOSPITAL_BASED_OUTPATIENT_CLINIC_OR_DEPARTMENT_OTHER): Admitting: Anesthesiology

## 2024-03-25 ENCOUNTER — Encounter (HOSPITAL_COMMUNITY)
Admission: RE | Disposition: A | Discharge status: Home / Self Care | Payer: Self-pay | Source: Home / Self Care | Attending: Orthopedic Surgery

## 2024-03-25 ENCOUNTER — Ambulatory Visit (HOSPITAL_COMMUNITY)
Admission: RE | Admit: 2024-03-25 | Discharge: 2024-03-25 | Disposition: A | Discharge status: Home / Self Care | Attending: Orthopedic Surgery | Admitting: Orthopedic Surgery

## 2024-03-25 DIAGNOSIS — Y835 Amputation of limb(s) as the cause of abnormal reaction of the patient, or of later complication, without mention of misadventure at the time of the procedure: Secondary | ICD-10-CM | POA: Diagnosis not present

## 2024-03-25 DIAGNOSIS — Z7984 Long term (current) use of oral hypoglycemic drugs: Secondary | ICD-10-CM | POA: Diagnosis not present

## 2024-03-25 DIAGNOSIS — M869 Osteomyelitis, unspecified: Secondary | ICD-10-CM | POA: Insufficient documentation

## 2024-03-25 DIAGNOSIS — E1169 Type 2 diabetes mellitus with other specified complication: Secondary | ICD-10-CM | POA: Diagnosis not present

## 2024-03-25 DIAGNOSIS — T8789 Other complications of amputation stump: Secondary | ICD-10-CM | POA: Diagnosis not present

## 2024-03-25 HISTORY — PX: AMPUTATION: SHX166

## 2024-03-25 LAB — CBC WITH DIFFERENTIAL/PLATELET
Abs Immature Granulocytes: 0.04 10*3/uL (ref 0.00–0.07)
Basophils Absolute: 0.1 10*3/uL (ref 0.0–0.1)
Basophils Relative: 1 %
Eosinophils Absolute: 0.3 10*3/uL (ref 0.0–0.5)
Eosinophils Relative: 4 %
HCT: 33.8 % — ABNORMAL LOW (ref 39.0–52.0)
Hemoglobin: 11.7 g/dL — ABNORMAL LOW (ref 13.0–17.0)
Immature Granulocytes: 1 %
Lymphocytes Relative: 20 %
Lymphs Abs: 1.4 10*3/uL (ref 0.7–4.0)
MCH: 32.7 pg (ref 26.0–34.0)
MCHC: 34.6 g/dL (ref 30.0–36.0)
MCV: 94.4 fL (ref 80.0–100.0)
Monocytes Absolute: 0.6 10*3/uL (ref 0.1–1.0)
Monocytes Relative: 10 %
Neutro Abs: 4.3 10*3/uL (ref 1.7–7.7)
Neutrophils Relative %: 64 %
Platelets: 295 10*3/uL (ref 150–400)
RBC: 3.58 MIL/uL — ABNORMAL LOW (ref 4.22–5.81)
RDW: 12.6 % (ref 11.5–15.5)
WBC: 6.7 10*3/uL (ref 4.0–10.5)
nRBC: 0 % (ref 0.0–0.2)

## 2024-03-25 LAB — COMPREHENSIVE METABOLIC PANEL WITH GFR
ALT: 17 U/L (ref 0–44)
AST: 25 U/L (ref 15–41)
Albumin: 3.8 g/dL (ref 3.5–5.0)
Alkaline Phosphatase: 76 U/L (ref 38–126)
Anion gap: 15 (ref 5–15)
BUN: 5 mg/dL — ABNORMAL LOW (ref 6–20)
CO2: 19 mmol/L — ABNORMAL LOW (ref 22–32)
Calcium: 8.7 mg/dL — ABNORMAL LOW (ref 8.9–10.3)
Chloride: 94 mmol/L — ABNORMAL LOW (ref 98–111)
Creatinine, Ser: 0.53 mg/dL — ABNORMAL LOW (ref 0.61–1.24)
GFR, Estimated: >60 mL/min (ref >60)
Glucose, Bld: 144 mg/dL — ABNORMAL HIGH (ref 70–99)
Potassium: 3.9 mmol/L (ref 3.5–5.1)
Sodium: 128 mmol/L — ABNORMAL LOW (ref 135–145)
Total Bilirubin: 0.6 mg/dL (ref 0.0–1.2)
Total Protein: 7.1 g/dL (ref 6.5–8.1)

## 2024-03-25 LAB — GLUCOSE, CAPILLARY
Glucose-Capillary: 158 mg/dL — ABNORMAL HIGH (ref 70–99)
Glucose-Capillary: 149 mg/dL — ABNORMAL HIGH (ref 70–99)
Glucose-Capillary: 167 mg/dL — ABNORMAL HIGH (ref 70–99)
Glucose-Capillary: 227 mg/dL — ABNORMAL HIGH (ref 70–99)

## 2024-03-25 LAB — HEMOGLOBIN A1C
Hgb A1c MFr Bld: 5.7 % — ABNORMAL HIGH (ref 4.8–5.6)
Mean Plasma Glucose: 116.89 mg/dL

## 2024-03-25 SURGERY — AMPUTATION, FOOT, PARTIAL
Anesthesia: Monitor Anesthesia Care | Site: Foot | Laterality: Left

## 2024-03-25 MED ORDER — FENTANYL CITRATE (PF) 250 MCG/5ML IJ SOLN
INTRAMUSCULAR | Status: AC
  Filled 2024-03-25: qty 5

## 2024-03-25 MED ORDER — ACETAMINOPHEN 500 MG PO TABS
1000.0000 mg | ORAL_TABLET | Freq: Once | ORAL | Status: AC
  Administered 2024-03-25: 1000 mg via ORAL

## 2024-03-25 MED ORDER — LACTATED RINGERS IV SOLN: INTRAVENOUS | Status: DC | PRN

## 2024-03-25 MED ORDER — PROPOFOL 10 MG/ML IV BOLUS
INTRAVENOUS | Status: AC
  Filled 2024-03-25: qty 20

## 2024-03-25 MED ORDER — MIDAZOLAM HCL 2 MG/2ML IJ SOLN: 2.0000 mg | Freq: Once | INTRAMUSCULAR | Status: AC

## 2024-03-25 MED ORDER — INSULIN ASPART 100 UNIT/ML IJ SOLN: 0.0000 [IU] | INTRAMUSCULAR | Status: DC | PRN

## 2024-03-25 MED ORDER — FENTANYL CITRATE (PF) 100 MCG/2ML IJ SOLN
INTRAMUSCULAR | Status: AC
  Administered 2024-03-25: 100 ug via INTRAVENOUS
  Filled 2024-03-25: qty 2

## 2024-03-25 MED ORDER — FENTANYL CITRATE (PF) 250 MCG/5ML IJ SOLN
INTRAMUSCULAR | Status: DC | PRN
  Administered 2024-03-25: 100 ug via INTRAVENOUS

## 2024-03-25 MED ORDER — CEFAZOLIN SODIUM-DEXTROSE 2-4 GM/100ML-% IV SOLN
2.0000 g | INTRAVENOUS | Status: AC
  Administered 2024-03-25: 2 g via INTRAVENOUS
  Filled 2024-03-25: qty 100

## 2024-03-25 MED ORDER — PROPOFOL 10 MG/ML IV BOLUS
INTRAVENOUS | Status: AC
Start: 2024-03-25 — End: 2024-03-25
  Filled 2024-03-25: qty 20

## 2024-03-25 MED ORDER — AMISULPRIDE (ANTIEMETIC) 5 MG/2ML IV SOLN
INTRAVENOUS | Status: AC
  Filled 2024-03-25: qty 4

## 2024-03-25 MED ORDER — VASHE WOUND IRRIGATION OPTIME
TOPICAL | Status: DC | PRN
  Administered 2024-03-25: 34 [oz_av] via TOPICAL

## 2024-03-25 MED ORDER — AMISULPRIDE (ANTIEMETIC) 5 MG/2ML IV SOLN
10.0000 mg | Freq: Once | INTRAVENOUS | Status: AC | PRN
  Administered 2024-03-25: 10 mg via INTRAVENOUS

## 2024-03-25 MED ORDER — LACTATED RINGERS IV SOLN: INTRAVENOUS | Status: DC

## 2024-03-25 MED ORDER — INSULIN ASPART 100 UNIT/ML IJ SOLN
INTRAMUSCULAR | Status: AC
  Administered 2024-03-25: 2 [IU] via SUBCUTANEOUS
  Filled 2024-03-25: qty 1

## 2024-03-25 MED ORDER — PROPOFOL 500 MG/50ML IV EMUL
INTRAVENOUS | Status: DC | PRN
  Administered 2024-03-25: 130 ug/kg/min via INTRAVENOUS

## 2024-03-25 MED ORDER — PROPOFOL 10 MG/ML IV BOLUS: INTRAVENOUS | Status: DC | PRN

## 2024-03-25 MED ORDER — OXYCODONE HCL 5 MG/5ML PO SOLN: 5.0000 mg | Freq: Once | ORAL | Status: DC | PRN

## 2024-03-25 MED ORDER — 0.9 % SODIUM CHLORIDE (POUR BTL) OPTIME
TOPICAL | Status: DC | PRN
  Administered 2024-03-25: 1000 mL

## 2024-03-25 MED ORDER — MIDAZOLAM HCL 2 MG/2ML IJ SOLN
INTRAMUSCULAR | Status: AC
  Administered 2024-03-25: 2 mg via INTRAVENOUS
  Filled 2024-03-25: qty 2

## 2024-03-25 MED ORDER — LABETALOL HCL 5 MG/ML IV SOLN
INTRAVENOUS | Status: AC
  Filled 2024-03-25: qty 4

## 2024-03-25 MED ORDER — FENTANYL CITRATE (PF) 100 MCG/2ML IJ SOLN: 100.0000 ug | Freq: Once | INTRAMUSCULAR | Status: AC

## 2024-03-25 MED ORDER — OXYCODONE HCL 5 MG PO TABS: 5.0000 mg | ORAL_TABLET | Freq: Once | ORAL | Status: DC | PRN

## 2024-03-25 MED ORDER — CHLORHEXIDINE GLUCONATE 0.12 % MT SOLN
15.0000 mL | Freq: Once | OROMUCOSAL | Status: AC
  Administered 2024-03-25: 15 mL via OROMUCOSAL
  Filled 2024-03-25: qty 15

## 2024-03-25 MED ORDER — ORAL CARE MOUTH RINSE: 15.0000 mL | Freq: Once | OROMUCOSAL | Status: AC

## 2024-03-25 MED ORDER — PROPOFOL 10 MG/ML IV BOLUS
INTRAVENOUS | Status: DC | PRN
  Administered 2024-03-25: 50 mg via INTRAVENOUS

## 2024-03-25 MED ORDER — FENTANYL CITRATE (PF) 100 MCG/2ML IJ SOLN: 25.0000 ug | INTRAMUSCULAR | Status: DC | PRN

## 2024-03-25 MED ORDER — ONDANSETRON HCL 4 MG/2ML IJ SOLN
INTRAMUSCULAR | Status: DC | PRN
  Administered 2024-03-25: 4 mg via INTRAVENOUS

## 2024-03-25 MED ORDER — ACETAMINOPHEN 500 MG PO TABS
ORAL_TABLET | ORAL | Status: AC
  Filled 2024-03-25: qty 2

## 2024-03-25 MED ORDER — PHENYLEPHRINE 80 MCG/ML (10ML) SYRINGE FOR IV PUSH (FOR BLOOD PRESSURE SUPPORT)
PREFILLED_SYRINGE | INTRAVENOUS | Status: DC | PRN
  Administered 2024-03-25: 80 ug via INTRAVENOUS

## 2024-03-25 SURGICAL SUPPLY — 30 items
BAG COUNTER SPONGE SURGICOUNT (BAG) ×1 IMPLANT
BENZOIN TINCTURE PRP APPL 2/3 (GAUZE/BANDAGES/DRESSINGS) ×1 IMPLANT
BLADE SAW SGTL HD 18.5X60.5X1. (BLADE) ×1 IMPLANT
BLADE SURG 21 STRL SS (BLADE) ×1 IMPLANT
BNDG COHESIVE 4X5 TAN STRL LF (GAUZE/BANDAGES/DRESSINGS) IMPLANT
BNDG GAUZE DERMACEA FLUFF 4 (GAUZE/BANDAGES/DRESSINGS) IMPLANT
CANISTER WOUND CARE 500ML ATS (WOUND CARE) IMPLANT
COVER SURGICAL LIGHT HANDLE (MISCELLANEOUS) ×1 IMPLANT
DRAPE INCISE IOBAN 66X45 STRL (DRAPES) ×1 IMPLANT
DRAPE U-SHAPE 47X51 STRL (DRAPES) ×1 IMPLANT
DRSG ADAPTIC 3X8 NADH LF (GAUZE/BANDAGES/DRESSINGS) IMPLANT
DURAPREP 26ML APPLICATOR (WOUND CARE) ×1 IMPLANT
ELECTRODE REM PT RTRN 9FT ADLT (ELECTROSURGICAL) ×1 IMPLANT
GAUZE PAD ABD 8X10 STRL (GAUZE/BANDAGES/DRESSINGS) IMPLANT
GAUZE SPONGE 4X4 12PLY STRL (GAUZE/BANDAGES/DRESSINGS) IMPLANT
GLOVE BIOGEL PI IND STRL 9 (GLOVE) ×1 IMPLANT
GLOVE SURG ORTHO 9.0 STRL STRW (GLOVE) ×1 IMPLANT
GOWN STRL REUS W/ TWL XL LVL3 (GOWN DISPOSABLE) ×3 IMPLANT
KIT BASIN OR (CUSTOM PROCEDURE TRAY) ×1 IMPLANT
KIT TURNOVER KIT B (KITS) ×1 IMPLANT
NS IRRIG 1000ML POUR BTL (IV SOLUTION) ×1 IMPLANT
PACK ORTHO EXTREMITY (CUSTOM PROCEDURE TRAY) ×1 IMPLANT
PAD ARMBOARD POSITIONER FOAM (MISCELLANEOUS) ×2 IMPLANT
SPONGE T-LAP 18X18 ~~LOC~~+RFID (SPONGE) IMPLANT
SUT ETHILON 2 0 PSLX (SUTURE) ×2 IMPLANT
TOWEL GREEN STERILE (TOWEL DISPOSABLE) ×1 IMPLANT
TOWEL GREEN STERILE FF (TOWEL DISPOSABLE) ×1 IMPLANT
TUBE CONNECTING 12X1/4 (SUCTIONS) ×1 IMPLANT
WATER STERILE IRR 1000ML POUR (IV SOLUTION) ×1 IMPLANT
YANKAUER SUCT BULB TIP NO VENT (SUCTIONS) ×1 IMPLANT

## 2024-03-25 NOTE — Discharge Instructions (Addendum)
 Do not put weight on the left foot.   Elevate the left leg/foot when at rest.  Lay down and elevate above the level of your heart. Eat protein rich foods, drink supplemental protein shakes if needed. Dry dressing changes

## 2024-03-25 NOTE — Transfer of Care (Signed)
 Immediate Anesthesia Transfer of Care Note  Patient: Blake Jackson  Procedure(s) Performed: TRANSMETATARSAL AMPUTATION OF THE LEFT FOOT (Left: Foot)  Patient Location: PACU  Anesthesia Type:MAC and Regional  Level of Consciousness: awake, alert , and oriented  Airway & Oxygen Therapy: Patient Spontanous Breathing and Patient connected to nasal cannula oxygen  Post-op Assessment: Report given to RN and Post -op Vital signs reviewed and stable  Post vital signs: Reviewed and stable  Last Vitals:  Vitals Value Taken Time  BP 128/92 03/25/24 11:45  Temp 99.1 1145  Pulse 108 03/25/24 11:50  Resp 14 03/25/24 11:50  SpO2 95 % 03/25/24 11:50  Vitals shown include unfiled device data.  Last Pain:  Vitals:   03/25/24 1007  TempSrc:   PainSc: 0-No pain      Patients Stated Pain Goal: 1 (03/25/24 0841)  Complications: No notable events documented.

## 2024-03-25 NOTE — Progress Notes (Signed)
 Dr. Gail Joseph made aware of patient's BMP result. Sodium 128. No new orders received.

## 2024-03-25 NOTE — Anesthesia Preprocedure Evaluation (Signed)
 Anesthesia Evaluation  Patient identified by MRN, date of birth, ID band Patient awake    Reviewed: Allergy & Precautions, NPO status , Patient's Chart, lab work & pertinent test results  Airway        Dental   Pulmonary asthma           Cardiovascular hypertension, negative cardio ROS      Neuro/Psych  PSYCHIATRIC DISORDERS Anxiety     negative neurological ROS     GI/Hepatic negative GI ROS,,,(+)     substance abuse (MS Contin)    Endo/Other  diabetes, Type 2, Oral Hypoglycemic Agents    Renal/GU negative Renal ROS  negative genitourinary   Musculoskeletal  (+) Arthritis ,  narcotic dependent  Abdominal   Peds  Hematology negative hematology ROS (+)   Anesthesia Other Findings   Reproductive/Obstetrics                              Anesthesia Physical Anesthesia Plan Anesthesia Quick Evaluation

## 2024-03-25 NOTE — Op Note (Signed)
 03/25/2024  11:44 AM  PATIENT:  Blake Jackson    PRE-OPERATIVE DIAGNOSIS:  LEFT FOOT NONHEALING AMPUTATION SITE GREAT TOE AND SECOND TOE WITH OSTEOMYELITIS  POST-OPERATIVE DIAGNOSIS:  Same  PROCEDURE:  TRANSMETATARSAL AMPUTATION OF THE LEFT FOOT  SURGEON:  Timothy Ford, MD  PHYSICIAN ASSISTANT:None ANESTHESIA:   General  PREOPERATIVE INDICATIONS:  Blake Jackson is a  58 y.o. male with a diagnosis of LEFT FOOT NONHEALING AMPUTATION SITE GREAT TOE AND SECOND TOE WITH OSTEOMYELITIS who failed conservative measures and elected for surgical management.    The risks benefits and alternatives were discussed with the patient preoperatively including but not limited to the risks of infection, bleeding, nerve injury, cardiopulmonary complications, the need for revision surgery, among others, and the patient was willing to proceed.  OPERATIVE IMPLANTS:   * No implants in log *  @ENCIMAGES @  OPERATIVE FINDINGS: Tissue margins were clear no signs of infection.  OPERATIVE PROCEDURE: Patient brought the operating room and underwent a regional anesthetic.  After adequate levels anesthesia were obtained patient's left lower extremity was prepped using DuraPrep draped into a sterile field a timeout was called.  A fishmouth incision was made through the distal foot this was carried down to the metatarsals.  An oscillating saw was used to perform a transmetatarsal amputation.  The electrocautery was used hemostasis.  The wound was irrigated with Vashe.  The incision was closed using 2-0 nylon a sterile dressing was applied patient was taken the PACU in stable condition.   DISCHARGE PLANNING:  Antibiotic duration: Preoperative antibiotics  Weightbearing: Ideally nonweightbearing on the left  Pain medication: Prescription for 4 Percocet patient is also on pain management.  Dressing care/ Wound VAC: Dry dressing  Ambulatory devices: Crutches  Discharge to: Home.  Follow-up: In the office  1 week post operative.

## 2024-03-25 NOTE — Interval H&P Note (Signed)
 History and Physical Interval Note:  03/25/2024 10:38 AM  Blake Jackson  has presented today for surgery, with the diagnosis of Osteomyelitis Right Foot.  The various methods of treatment have been discussed with the patient and family. After consideration of risks, benefits and other options for treatment, the patient has consented to  Procedure(s) with comments: AMPUTATION, FOOT, PARTIAL (Right) - RIGHT TRANSMETATARSAL AMPUTATION as a surgical intervention.  The patient's history has been reviewed, patient examined, no change in status, stable for surgery.  I have reviewed the patient's chart and labs.  Questions were answered to the patient's satisfaction.     Akia Montalban V Jaeanna Mccomber

## 2024-03-28 ENCOUNTER — Encounter (HOSPITAL_COMMUNITY): Payer: Self-pay | Admitting: Orthopedic Surgery

## 2024-03-29 MED ORDER — DEXAMETHASONE SODIUM PHOSPHATE 10 MG/ML IJ SOLN
INTRAMUSCULAR | Status: DC | PRN
  Administered 2024-03-25: 10 mg

## 2024-03-29 MED ORDER — ROPIVACAINE HCL 5 MG/ML IJ SOLN
INTRAMUSCULAR | Status: DC | PRN
  Administered 2024-03-25: 30 mL via PERINEURAL

## 2024-03-29 NOTE — Anesthesia Postprocedure Evaluation (Signed)
 Anesthesia Post Note  Patient: Blake Jackson  Procedure(s) Performed: TRANSMETATARSAL AMPUTATION OF THE LEFT FOOT (Left: Foot)     Patient location during evaluation: PACU Anesthesia Type: MAC and Regional Level of consciousness: awake and alert Pain management: pain level controlled Vital Signs Assessment: post-procedure vital signs reviewed and stable Respiratory status: spontaneous breathing, nonlabored ventilation, respiratory function stable and patient connected to nasal cannula oxygen Cardiovascular status: stable and blood pressure returned to baseline Postop Assessment: no apparent nausea or vomiting Anesthetic complications: no  There were no known notable events for this encounter.  Last Vitals:  Vitals:   03/25/24 1315 03/25/24 1330  BP: 111/77 121/82  Pulse: (!) 114 (!) 110  Resp: 15 11  Temp:  36.6 C  SpO2: 98% 97%    Last Pain:  Vitals:   03/25/24 1330  TempSrc:   PainSc: 0-No pain   Pain Goal: Patients Stated Pain Goal: 1 (03/25/24 0841)                 [name]

## 2024-03-29 NOTE — Anesthesia Procedure Notes (Signed)
 Anesthesia Regional Block: Popliteal block   Pre-Anesthetic Checklist: , timeout performed,  Correct Patient, Correct Site, Correct Laterality,  Correct Procedure, Correct Position, site marked,  Risks and benefits discussed,  Pre-op evaluation,  At surgeon's request and post-op pain management  Laterality: Left  Prep: Maximum Sterile Barrier Precautions used, chloraprep       Needles:  Injection technique: Single-shot  Needle Type: Echogenic Stimulator Needle     Needle Length: 9cm  Needle Gauge: 21     Additional Needles:   Procedures:,,,, ultrasound used (permanent image in chart),,    Narrative:  Start time: 03/25/2024 10:05 AM End time: 03/25/2024 10:09 AM Injection made incrementally with aspirations every 5 mL. Anesthesiologist: Niels Marien CROME, MD

## 2024-04-06 ENCOUNTER — Ambulatory Visit (INDEPENDENT_AMBULATORY_CARE_PROVIDER_SITE_OTHER): Admitting: Family

## 2024-04-06 ENCOUNTER — Encounter: Payer: Self-pay | Admitting: Family

## 2024-04-06 DIAGNOSIS — Z89432 Acquired absence of left foot: Secondary | ICD-10-CM

## 2024-04-06 NOTE — Progress Notes (Signed)
   Post-Op Visit Note   Patient: Blake Jackson           Date of Birth: July 31, 1966           MRN: [id number] Visit Date: 04/06/2024 PCP: Shepard Ade, MD  Chief Complaint:  Chief Complaint  Patient presents with   Left Foot - Routine Post Op    03/25/2024 left TMA    HPI:  HPI The patient is a 58 year old gentleman who is seen status post transmetatarsal amputation June 20 Ortho Exam On examination left foot the amputation site is approximated with sutures laterally there is a 1 and half centimeter area of impending dehiscence with hematoma present in the bed there is surrounding maceration there is no erythema no warmth  Visit Diagnoses: No diagnosis found.  Plan: Begin daily Dial soap cleansing.  Dry dressings.  Nonweightbearing.  Elevate for swelling he will follow-up in the office with Dr. Harden in about 2 weeks.  Discussed he may return or call for any concern in the interim  Follow-Up Instructions: No follow-ups on file.   Imaging: No results found.  Orders:  No orders of the defined types were placed in this encounter.  No orders of the defined types were placed in this encounter.    PMFS History: Patient Active Problem List   Diagnosis Date Noted   Osteomyelitis of great toe of left foot (HCC) 11/18/2023   RHINITIS, CHRONIC 05/09/2009   ASTHMA 05/09/2009   BACK PAIN, CHRONIC 05/09/2009   Past Medical History:  Diagnosis Date   Anxiety    Arthritis    Asthma    Cellulitis of great toe, left    Diabetes mellitus without complication (HCC)    Hypertension    pt states he no longer has this   Spinal stenosis     History reviewed. No pertinent family history.  Past Surgical History:  Procedure Laterality Date   AMPUTATION Left 11/18/2023   Procedure: LEFT FOOT AMPUTATION GREAT TOE AND 2ND TOE;  Surgeon: Harden Jerona GAILS, MD;  Location: [facility] OR;  Service: Orthopedics;  Laterality: Left;   AMPUTATION Left 03/25/2024   Procedure: TRANSMETATARSAL AMPUTATION  OF THE LEFT FOOT;  Surgeon: Harden Jerona GAILS, MD;  Location: [facility] OR;  Service: Orthopedics;  Laterality: Left;   HERNIA REPAIR     umbilical   Social History   Occupational History   Not on file  Tobacco Use   Smoking status: Never   Smokeless tobacco: Never  Vaping Use   Vaping status: Never Used  Substance and Sexual Activity   Alcohol use: Not Currently   Drug use: Never   Sexual activity: Not on file

## 2024-04-12 ENCOUNTER — Other Ambulatory Visit: Payer: Self-pay | Admitting: Family

## 2024-04-21 ENCOUNTER — Encounter: Admitting: Orthopedic Surgery

## 2024-04-26 ENCOUNTER — Encounter: Payer: Self-pay | Admitting: Orthopedic Surgery

## 2024-04-26 ENCOUNTER — Ambulatory Visit (INDEPENDENT_AMBULATORY_CARE_PROVIDER_SITE_OTHER): Admitting: Orthopedic Surgery

## 2024-04-26 DIAGNOSIS — T8781 Dehiscence of amputation stump: Secondary | ICD-10-CM

## 2024-04-26 DIAGNOSIS — Z89432 Acquired absence of left foot: Secondary | ICD-10-CM

## 2024-04-26 MED ORDER — NITROGLYCERIN 0.2 MG/HR TD PT24: 0.2000 mg | MEDICATED_PATCH | Freq: Every day | TRANSDERMAL | 12 refills | Status: AC

## 2024-04-26 NOTE — Progress Notes (Signed)
 Office Visit Note   Patient: Blake Jackson           Date of Birth: [birth year]           MRN: [id number] Visit Date: 04/26/2024              Requested by: Shepard Ade, MD [address] [place],  [place] [address] PCP: Shepard Ade, MD  Chief Complaint  Patient presents with   Left Foot - Routine Post Op    03/25/2024 left TMA      HPI: Patient is a 58 year old gentleman who is seen 4 weeks status post left transmetatarsal amputation.  Patient has had dehiscence of the surgical incision with increased swelling.  Patient has been started on doxycycline .  Patient is currently been on crutches nonweightbearing.  Patient just started a continuous glucose monitor.  Most recent hemoglobin A1c is 5.5.  He states his sugars are now sometimes in the 40s.  Assessment & Plan: Visit Diagnoses:  1. History of transmetatarsal amputation of left foot (HCC)   2. Dehiscence of amputation stump of left lower extremity (HCC)     Plan: Will start patient on nitroglycerin  patch to help improve the microcirculation.  Will start Vashe dressing changes daily.  Patient will get doxycycline .  Plan for revision of the transmetatarsal amputation on Friday with overnight observation.  Follow-Up Instructions: No follow-ups on file.   Ortho Exam  Patient is alert, oriented, no adenopathy, well-dressed, normal affect, normal respiratory effort. Examination patient has wound dehiscence laterally of the left transmetatarsal amputation that extends down to bone.  The wound is 5 cm in diameter and 3 cm deep.  Patient has a strong palpable dorsalis pedis pulse.  There are no ischemic gangrenous changes.    Imaging: No results found. No images are attached to the encounter.  Labs: Lab Results  Component Value Date   HGBA1C 5.7 (H) 03/25/2024     Lab Results  Component Value Date   ALBUMIN 3.8 03/25/2024   ALBUMIN 4.8 11/02/2006    No results found for: MG No results found for:  VD25OH  No results found for: PREALBUMIN    Latest Ref Rng & Units 03/25/2024    9:00 AM 11/18/2023    7:17 AM 11/02/2006   12:48 PM  CBC EXTENDED  WBC 4.0 - 10.5 K/uL 6.7  7.0  8.0   RBC 4.22 - 5.81 MIL/uL 3.58  3.63  4.35   Hemoglobin 13.0 - 17.0 g/dL 88.2  88.5  85.5   HCT 39.0 - 52.0 % 33.8  34.5  40.1   Platelets 150 - 400 K/uL 295  319  252   NEUT# 1.7 - 7.7 K/uL 4.3   4.9   Lymph# 0.7 - 4.0 K/uL 1.4        There is no height or weight on file to calculate BMI.  Orders:  No orders of the defined types were placed in this encounter.  No orders of the defined types were placed in this encounter.    Procedures: No procedures performed  Clinical Data: No additional findings.  ROS:  All other systems negative, except as noted in the HPI. Review of Systems  Objective: Vital Signs: There were no vitals taken for this visit.  Specialty Comments:  No specialty comments available.  PMFS History: Patient Active Problem List   Diagnosis Date Noted   Osteomyelitis of great toe of left foot (HCC) 11/18/2023   RHINITIS, CHRONIC 05/09/2009   ASTHMA 05/09/2009  BACK PAIN, CHRONIC 05/09/2009   Past Medical History:  Diagnosis Date   Anxiety    Arthritis    Asthma    Cellulitis of great toe, left    Diabetes mellitus without complication (HCC)    Hypertension    pt states he no longer has this   Spinal stenosis     History reviewed. No pertinent family history.  Past Surgical History:  Procedure Laterality Date   AMPUTATION Left 11/18/2023   Procedure: LEFT FOOT AMPUTATION GREAT TOE AND 2ND TOE;  Surgeon: Harden Jerona GAILS, MD;  Location: [facility] OR;  Service: Orthopedics;  Laterality: Left;   AMPUTATION Left 03/25/2024   Procedure: TRANSMETATARSAL AMPUTATION OF THE LEFT FOOT;  Surgeon: Harden Jerona GAILS, MD;  Location: [facility] OR;  Service: Orthopedics;  Laterality: Left;   HERNIA REPAIR     umbilical   Social History   Occupational History   Not on file  Tobacco Use    Smoking status: Never   Smokeless tobacco: Never  Vaping Use   Vaping status: Never Used  Substance and Sexual Activity   Alcohol use: Not Currently   Drug use: Never   Sexual activity: Not on file

## 2024-04-27 NOTE — H&P (Signed)
 Blake Jackson is an 58 y.o. male.   Chief Complaint: Dehiscence of amputation stump left TMA HPI:  Patient is a 58 year old gentleman who is seen 4 weeks status post left transmetatarsal amputation. Patient has had dehiscence of the surgical incision with increased swelling. Patient has been started on doxycycline . Patient is currently been on crutches nonweightbearing. Patient just started a continuous glucose monitor. Most recent hemoglobin A1c is 5.5. He states his sugars are now sometimes in the 40s.    Past Medical History:  Diagnosis Date   Anxiety    Arthritis    Asthma    Cellulitis of great toe, left    Diabetes mellitus without complication (HCC)    Hypertension    pt states he no longer has this   Spinal stenosis     Past Surgical History:  Procedure Laterality Date   AMPUTATION Left 11/18/2023   Procedure: LEFT FOOT AMPUTATION GREAT TOE AND 2ND TOE;  Surgeon: Harden Jerona GAILS, MD;  Location: [facility] OR;  Service: Orthopedics;  Laterality: Left;   AMPUTATION Left 03/25/2024   Procedure: TRANSMETATARSAL AMPUTATION OF THE LEFT FOOT;  Surgeon: Harden Jerona GAILS, MD;  Location: [facility] OR;  Service: Orthopedics;  Laterality: Left;   HERNIA REPAIR     umbilical    No family history on file. Social History:  reports that he has never smoked. He has never used smokeless tobacco. He reports that he does not currently use alcohol. He reports that he does not use drugs.  Allergies: No Known Allergies  No medications prior to admission.    No results found for this or any previous visit (from the past 48 hours). No results found.  Review of Systems  All other systems reviewed and are negative.   There were no vitals taken for this visit. Physical Exam  Patient is alert, oriented, no adenopathy, well-dressed, normal affect, normal respiratory effort. Examination patient has wound dehiscence laterally of the left transmetatarsal amputation that extends down to bone.  The wound is 5 cm  in diameter and 3 cm deep.  Patient has a strong palpable dorsalis pedis pulse.  There are no ischemic gangrenous changes    Assessment/Plan Visit Diagnoses:  1. History of transmetatarsal amputation of left foot (HCC)   2. Dehiscence of amputation stump of left lower extremity (HCC)       Plan: Will start patient on nitroglycerin  patch to help improve the microcirculation.  Will start Vashe dressing changes daily.  Patient will get doxycycline .  Plan for revision of the transmetatarsal amputation on Friday with overnight observation.  Blake Deland Collet, PA-C 04/27/2024, 4:57 PM

## 2024-04-28 ENCOUNTER — Other Ambulatory Visit: Payer: Self-pay

## 2024-04-28 ENCOUNTER — Encounter (HOSPITAL_COMMUNITY): Payer: Self-pay | Admitting: Orthopedic Surgery

## 2024-04-28 NOTE — Anesthesia Preprocedure Evaluation (Signed)
 Anesthesia Evaluation  Patient identified by MRN, date of birth, ID band Patient awake    Reviewed: Allergy & Precautions, NPO status , Patient's Chart, lab work & pertinent test results  History of Anesthesia Complications (+) PONV and history of anesthetic complications  Airway Mallampati: II  TM Distance: >3 FB Neck ROM: Full    Dental  (+) Dental Advisory Given   Pulmonary asthma    Pulmonary exam normal breath sounds clear to auscultation       Cardiovascular hypertension, Normal cardiovascular exam Rhythm:Regular Rate:Normal     Neuro/Psych  PSYCHIATRIC DISORDERS Anxiety     negative neurological ROS     GI/Hepatic negative GI ROS,,,(+)     substance abuse (MS Contin)    Endo/Other  diabetes, Type 2, Oral Hypoglycemic Agents    Renal/GU negative Renal ROS  negative genitourinary   Musculoskeletal  (+) Arthritis ,  narcotic dependent  Abdominal   Peds  Hematology negative hematology ROS (+)   Anesthesia Other Findings   Reproductive/Obstetrics                              Anesthesia Physical Anesthesia Plan  ASA: 3  Anesthesia Plan: MAC and Regional   Post-op Pain Management: Regional block* and Tylenol  PO (pre-op)*   Induction: Intravenous  PONV Risk Score and Plan: 1 and Propofol  infusion, Treatment may vary due to age or medical condition, Midazolam  and Ondansetron   Airway Management Planned: Natural Airway  Additional Equipment:   Intra-op Plan:   Post-operative Plan:   Informed Consent: I have reviewed the patients History and Physical, chart, labs and discussed the procedure including the risks, benefits and alternatives for the proposed anesthesia with the patient or authorized representative who has indicated his/her understanding and acceptance.     Dental advisory given  Plan Discussed with: CRNA  Anesthesia Plan Comments:         Anesthesia  Quick Evaluation

## 2024-04-28 NOTE — Progress Notes (Signed)
 SDW CALL  Patient was given pre-op instructions over the phone. The opportunity was given for the patient to ask questions. No further questions asked. Patient verbalized understanding of instructions given.   PCP - Dr. Charlie Love Cardiologist - denies  PPM/ICD - denies  Chest x-ray - denies EKG - 11/18/23 Stress Test - denies ECHO - denies Cardiac Cath - denies  Sleep Study - denies   Fasting Blood Sugar - 120 Patient is wearing a Dexcom on his left arm.    Last does of Doreen was 7/24 - patient aware not to take DOS.   Last dose of GLP1 agonist-  n/a GLP1 instructions:  n/a  Blood Thinner Instructions:  n/a Aspirin Instructions: n/a  ERAS Protcol - NPO   COVID TEST- n/a   Anesthesia review: no  Patient denies shortness of breath, fever, cough and chest pain over the phone call   All instructions explained to the patient, with a verbal understanding of the material. Patient agrees to go over the instructions while at home for a better understanding.

## 2024-04-29 ENCOUNTER — Other Ambulatory Visit: Payer: Self-pay

## 2024-04-29 ENCOUNTER — Encounter (HOSPITAL_COMMUNITY)
Admission: RE | Disposition: A | Discharge status: Home / Self Care | Payer: Self-pay | Source: Home / Self Care | Attending: Orthopedic Surgery

## 2024-04-29 ENCOUNTER — Encounter (HOSPITAL_COMMUNITY): Payer: Self-pay

## 2024-04-29 ENCOUNTER — Observation Stay (HOSPITAL_COMMUNITY)
Admission: RE | Admit: 2024-04-29 | Discharge: 2024-04-30 | Disposition: A | Discharge status: Home / Self Care | Attending: Orthopedic Surgery | Admitting: Orthopedic Surgery

## 2024-04-29 ENCOUNTER — Ambulatory Visit (HOSPITAL_BASED_OUTPATIENT_CLINIC_OR_DEPARTMENT_OTHER): Payer: Self-pay

## 2024-04-29 ENCOUNTER — Encounter (HOSPITAL_COMMUNITY): Payer: Self-pay | Admitting: Orthopedic Surgery

## 2024-04-29 ENCOUNTER — Telehealth: Payer: Self-pay | Admitting: Orthopedic Surgery

## 2024-04-29 DIAGNOSIS — J45909 Unspecified asthma, uncomplicated: Secondary | ICD-10-CM | POA: Diagnosis not present

## 2024-04-29 DIAGNOSIS — E119 Type 2 diabetes mellitus without complications: Secondary | ICD-10-CM | POA: Insufficient documentation

## 2024-04-29 DIAGNOSIS — T8781 Dehiscence of amputation stump: Secondary | ICD-10-CM | POA: Diagnosis present

## 2024-04-29 DIAGNOSIS — Y828 Other medical devices associated with adverse incidents: Secondary | ICD-10-CM | POA: Insufficient documentation

## 2024-04-29 DIAGNOSIS — T8789 Other complications of amputation stump: Secondary | ICD-10-CM | POA: Diagnosis present

## 2024-04-29 DIAGNOSIS — I10 Essential (primary) hypertension: Secondary | ICD-10-CM

## 2024-04-29 DIAGNOSIS — T8189XA Other complications of procedures, not elsewhere classified, initial encounter: Secondary | ICD-10-CM | POA: Diagnosis not present

## 2024-04-29 HISTORY — DX: Other specified postprocedural states: Z98.890

## 2024-04-29 HISTORY — PX: AMPUTATION: SHX166

## 2024-04-29 LAB — CBC WITH DIFFERENTIAL/PLATELET
Abs Immature Granulocytes: 0.05 K/uL (ref 0.00–0.07)
Basophils Absolute: 0.1 K/uL (ref 0.0–0.1)
Basophils Relative: 1 %
Eosinophils Absolute: 0.1 K/uL (ref 0.0–0.5)
Eosinophils Relative: 1 %
HCT: 29.4 % — ABNORMAL LOW (ref 39.0–52.0)
Hemoglobin: 9.6 g/dL — ABNORMAL LOW (ref 13.0–17.0)
Immature Granulocytes: 1 %
Lymphocytes Relative: 7 %
Lymphs Abs: 0.6 K/uL — ABNORMAL LOW (ref 0.7–4.0)
MCH: 30.8 pg (ref 26.0–34.0)
MCHC: 32.7 g/dL (ref 30.0–36.0)
MCV: 94.2 fL (ref 80.0–100.0)
Monocytes Absolute: 0.8 K/uL (ref 0.1–1.0)
Monocytes Relative: 11 %
Neutro Abs: 6.4 K/uL (ref 1.7–7.7)
Neutrophils Relative %: 79 %
Platelets: 398 K/uL (ref 150–400)
RBC: 3.12 MIL/uL — ABNORMAL LOW (ref 4.22–5.81)
RDW: 12.8 % (ref 11.5–15.5)
WBC: 8 K/uL (ref 4.0–10.5)
nRBC: 0 % (ref 0.0–0.2)

## 2024-04-29 LAB — COMPREHENSIVE METABOLIC PANEL WITH GFR
ALT: 17 U/L (ref 0–44)
AST: 19 U/L (ref 15–41)
Albumin: 3.2 g/dL — ABNORMAL LOW (ref 3.5–5.0)
Alkaline Phosphatase: 121 U/L (ref 38–126)
Anion gap: 14 (ref 5–15)
BUN: 7 mg/dL (ref 6–20)
CO2: 19 mmol/L — ABNORMAL LOW (ref 22–32)
Calcium: 9.1 mg/dL (ref 8.9–10.3)
Chloride: 99 mmol/L (ref 98–111)
Creatinine, Ser: 0.69 mg/dL (ref 0.61–1.24)
GFR, Estimated: >60 mL/min (ref >60)
Glucose, Bld: 123 mg/dL — ABNORMAL HIGH (ref 70–99)
Potassium: 4.2 mmol/L (ref 3.5–5.1)
Sodium: 132 mmol/L — ABNORMAL LOW (ref 135–145)
Total Bilirubin: 0.9 mg/dL (ref 0.0–1.2)
Total Protein: 7.3 g/dL (ref 6.5–8.1)

## 2024-04-29 LAB — GLUCOSE, CAPILLARY
Glucose-Capillary: 122 mg/dL — ABNORMAL HIGH (ref 70–99)
Glucose-Capillary: 119 mg/dL — ABNORMAL HIGH (ref 70–99)
Glucose-Capillary: 125 mg/dL — ABNORMAL HIGH (ref 70–99)
Glucose-Capillary: 126 mg/dL — ABNORMAL HIGH (ref 70–99)
Glucose-Capillary: 262 mg/dL — ABNORMAL HIGH (ref 70–99)
Glucose-Capillary: 205 mg/dL — ABNORMAL HIGH (ref 70–99)

## 2024-04-29 SURGERY — AMPUTATION, FOOT, PARTIAL
Anesthesia: Monitor Anesthesia Care | Site: Foot | Laterality: Left

## 2024-04-29 MED ORDER — HYDROMORPHONE HCL 1 MG/ML IJ SOLN
INTRAMUSCULAR | Status: AC
  Filled 2024-04-29: qty 2

## 2024-04-29 MED ORDER — ALBUTEROL SULFATE (2.5 MG/3ML) 0.083% IN NEBU: 3.0000 mL | INHALATION_SOLUTION | Freq: Four times a day (QID) | RESPIRATORY_TRACT | Status: DC | PRN

## 2024-04-29 MED ORDER — OXYCODONE HCL 5 MG PO TABS: 10.0000 mg | ORAL_TABLET | ORAL | Status: DC | PRN

## 2024-04-29 MED ORDER — OXYCODONE HCL 5 MG PO TABS: 5.0000 mg | ORAL_TABLET | ORAL | Status: DC | PRN

## 2024-04-29 MED ORDER — CHLORHEXIDINE GLUCONATE 0.12 % MT SOLN
15.0000 mL | Freq: Once | OROMUCOSAL | Status: AC
  Administered 2024-04-29: 15 mL via OROMUCOSAL
  Filled 2024-04-29: qty 15

## 2024-04-29 MED ORDER — VASHE WOUND IRRIGATION OPTIME
TOPICAL | Status: DC | PRN
  Administered 2024-04-29: 34 [oz_av]

## 2024-04-29 MED ORDER — PANTOPRAZOLE SODIUM 40 MG PO TBEC
40.0000 mg | DELAYED_RELEASE_TABLET | Freq: Every day | ORAL | Status: DC
  Administered 2024-04-30: 40 mg via ORAL
  Filled 2024-04-29: qty 1

## 2024-04-29 MED ORDER — MIDAZOLAM HCL 2 MG/2ML IJ SOLN
INTRAMUSCULAR | Status: AC
  Filled 2024-04-29: qty 2

## 2024-04-29 MED ORDER — IBUPROFEN 200 MG PO TABS: 400.0000 mg | ORAL_TABLET | Freq: Four times a day (QID) | ORAL | Status: DC | PRN

## 2024-04-29 MED ORDER — SODIUM CHLORIDE 0.9 % IV SOLN: INTRAVENOUS | Status: DC

## 2024-04-29 MED ORDER — DEXAMETHASONE SODIUM PHOSPHATE 10 MG/ML IJ SOLN
INTRAMUSCULAR | Status: DC | PRN
  Administered 2024-04-29: 10 mg via INTRAVENOUS

## 2024-04-29 MED ORDER — ALPRAZOLAM 0.5 MG PO TABS: 0.5000 mg | ORAL_TABLET | Freq: Four times a day (QID) | ORAL | Status: DC | PRN

## 2024-04-29 MED ORDER — OXYCODONE HCL 5 MG PO TABS
ORAL_TABLET | ORAL | Status: AC
  Filled 2024-04-29: qty 1

## 2024-04-29 MED ORDER — INSULIN ASPART 100 UNIT/ML IJ SOLN: 0.0000 [IU] | INTRAMUSCULAR | Status: DC | PRN

## 2024-04-29 MED ORDER — DAPAGLIFLOZIN PROPANEDIOL 10 MG PO TABS
10.0000 mg | ORAL_TABLET | Freq: Every day | ORAL | Status: DC
  Administered 2024-04-30: 10 mg via ORAL
  Filled 2024-04-29 (×2): qty 1

## 2024-04-29 MED ORDER — HYDROMORPHONE HCL 1 MG/ML IJ SOLN: 0.5000 mg | INTRAMUSCULAR | Status: DC | PRN

## 2024-04-29 MED ORDER — FENTANYL CITRATE (PF) 250 MCG/5ML IJ SOLN
INTRAMUSCULAR | Status: DC | PRN
  Administered 2024-04-29: 100 ug via INTRAVENOUS

## 2024-04-29 MED ORDER — DEXAMETHASONE SODIUM PHOSPHATE 10 MG/ML IJ SOLN
INTRAMUSCULAR | Status: AC
  Filled 2024-04-29: qty 1

## 2024-04-29 MED ORDER — LACTATED RINGERS IV SOLN: INTRAVENOUS | Status: DC

## 2024-04-29 MED ORDER — FENTANYL CITRATE (PF) 100 MCG/2ML IJ SOLN
INTRAMUSCULAR | Status: AC
  Filled 2024-04-29: qty 2

## 2024-04-29 MED ORDER — ACETAMINOPHEN 10 MG/ML IV SOLN: 1000.0000 mg | Freq: Once | INTRAVENOUS | Status: DC | PRN

## 2024-04-29 MED ORDER — OXYCODONE HCL 5 MG/5ML PO SOLN: 5.0000 mg | Freq: Once | ORAL | Status: AC | PRN

## 2024-04-29 MED ORDER — PNEUMOCOCCAL 20-VAL CONJ VACC 0.5 ML IM SUSY
0.5000 mL | PREFILLED_SYRINGE | INTRAMUSCULAR | Status: AC
  Administered 2024-04-30: 0.5 mL via INTRAMUSCULAR
  Filled 2024-04-29: qty 0.5

## 2024-04-29 MED ORDER — ONDANSETRON HCL 4 MG/2ML IJ SOLN
INTRAMUSCULAR | Status: AC
  Filled 2024-04-29: qty 2

## 2024-04-29 MED ORDER — MORPHINE SULFATE ER 15 MG PO TBCR
30.0000 mg | EXTENDED_RELEASE_TABLET | Freq: Three times a day (TID) | ORAL | Status: DC
  Administered 2024-04-29 – 2024-04-30 (×3): 30 mg via ORAL
  Filled 2024-04-29 (×3): qty 2

## 2024-04-29 MED ORDER — PROPOFOL 10 MG/ML IV BOLUS
INTRAVENOUS | Status: AC
  Filled 2024-04-29: qty 20

## 2024-04-29 MED ORDER — CEFAZOLIN SODIUM-DEXTROSE 2-4 GM/100ML-% IV SOLN
2.0000 g | INTRAVENOUS | Status: AC
  Administered 2024-04-29: 2 g via INTRAVENOUS
  Filled 2024-04-29: qty 100

## 2024-04-29 MED ORDER — VANCOMYCIN HCL 1000 MG IV SOLR
INTRAVENOUS | Status: DC | PRN
Start: 2024-04-29 — End: 2024-04-29
  Administered 2024-04-29: 1000 mg

## 2024-04-29 MED ORDER — DROPERIDOL 2.5 MG/ML IJ SOLN: 0.6250 mg | Freq: Once | INTRAMUSCULAR | Status: DC | PRN

## 2024-04-29 MED ORDER — PROPOFOL 10 MG/ML IV BOLUS
INTRAVENOUS | Status: DC | PRN
  Administered 2024-04-29: 200 mg via INTRAVENOUS

## 2024-04-29 MED ORDER — METOCLOPRAMIDE HCL 5 MG PO TABS: 5.0000 mg | ORAL_TABLET | Freq: Three times a day (TID) | ORAL | Status: DC | PRN

## 2024-04-29 MED ORDER — ONDANSETRON HCL 4 MG/2ML IJ SOLN
INTRAMUSCULAR | Status: DC | PRN
  Administered 2024-04-29: 4 mg via INTRAVENOUS

## 2024-04-29 MED ORDER — PREGABALIN 75 MG PO CAPS
150.0000 mg | ORAL_CAPSULE | Freq: Two times a day (BID) | ORAL | Status: DC
  Administered 2024-04-29 – 2024-04-30 (×3): 150 mg via ORAL
  Filled 2024-04-29 (×3): qty 2

## 2024-04-29 MED ORDER — ACETAMINOPHEN 500 MG PO TABS
1000.0000 mg | ORAL_TABLET | Freq: Once | ORAL | Status: AC
  Administered 2024-04-29: 1000 mg via ORAL
  Filled 2024-04-29: qty 2

## 2024-04-29 MED ORDER — ACETAMINOPHEN 500 MG PO TABS
1000.0000 mg | ORAL_TABLET | Freq: Four times a day (QID) | ORAL | Status: AC
  Administered 2024-04-29: 1000 mg via ORAL
  Filled 2024-04-29 (×2): qty 2

## 2024-04-29 MED ORDER — FENTANYL CITRATE (PF) 100 MCG/2ML IJ SOLN
25.0000 ug | INTRAMUSCULAR | Status: DC | PRN
  Administered 2024-04-29 (×2): 50 ug via INTRAVENOUS

## 2024-04-29 MED ORDER — LIDOCAINE 2% (20 MG/ML) 5 ML SYRINGE
INTRAMUSCULAR | Status: AC
  Filled 2024-04-29: qty 5

## 2024-04-29 MED ORDER — METFORMIN HCL ER 500 MG PO TB24
500.0000 mg | ORAL_TABLET | Freq: Two times a day (BID) | ORAL | Status: DC
  Administered 2024-04-29 – 2024-04-30 (×2): 500 mg via ORAL
  Filled 2024-04-29 (×2): qty 1

## 2024-04-29 MED ORDER — FENTANYL CITRATE (PF) 100 MCG/2ML IJ SOLN
INTRAMUSCULAR | Status: AC
Start: 2024-04-29 — End: 2024-04-29
  Filled 2024-04-29: qty 2

## 2024-04-29 MED ORDER — ORAL CARE MOUTH RINSE: 15.0000 mL | Freq: Once | OROMUCOSAL | Status: AC

## 2024-04-29 MED ORDER — OXYCODONE-ACETAMINOPHEN 7.5-325 MG PO TABS: 1.0000 | ORAL_TABLET | Freq: Four times a day (QID) | ORAL | 0 refills | Status: DC | PRN

## 2024-04-29 MED ORDER — OXYCODONE HCL 5 MG PO TABS
5.0000 mg | ORAL_TABLET | Freq: Once | ORAL | Status: AC | PRN
  Administered 2024-04-29: 5 mg via ORAL

## 2024-04-29 MED ORDER — MIDAZOLAM HCL 2 MG/2ML IJ SOLN
INTRAMUSCULAR | Status: DC | PRN
  Administered 2024-04-29: 2 mg via INTRAVENOUS

## 2024-04-29 MED ORDER — ONDANSETRON HCL 4 MG PO TABS: 4.0000 mg | ORAL_TABLET | Freq: Four times a day (QID) | ORAL | Status: DC | PRN

## 2024-04-29 MED ORDER — LIDOCAINE 2% (20 MG/ML) 5 ML SYRINGE
INTRAMUSCULAR | Status: DC | PRN
  Administered 2024-04-29: 100 mg via INTRAVENOUS

## 2024-04-29 MED ORDER — HYDROMORPHONE HCL 1 MG/ML IJ SOLN
0.2500 mg | INTRAMUSCULAR | Status: DC | PRN
  Administered 2024-04-29 (×4): 0.5 mg via INTRAVENOUS

## 2024-04-29 MED ORDER — VANCOMYCIN HCL 1000 MG IV SOLR
INTRAVENOUS | Status: AC
  Filled 2024-04-29: qty 20

## 2024-04-29 MED ORDER — DOCUSATE SODIUM 100 MG PO CAPS
100.0000 mg | ORAL_CAPSULE | Freq: Two times a day (BID) | ORAL | Status: DC
  Administered 2024-04-29 – 2024-04-30 (×2): 100 mg via ORAL
  Filled 2024-04-29 (×2): qty 1

## 2024-04-29 MED ORDER — ACETAMINOPHEN 325 MG PO TABS: 325.0000 mg | ORAL_TABLET | Freq: Four times a day (QID) | ORAL | Status: DC | PRN

## 2024-04-29 MED ORDER — METOCLOPRAMIDE HCL 5 MG/ML IJ SOLN: 5.0000 mg | Freq: Three times a day (TID) | INTRAMUSCULAR | Status: DC | PRN

## 2024-04-29 MED ORDER — ONDANSETRON HCL 4 MG/2ML IJ SOLN: 4.0000 mg | Freq: Four times a day (QID) | INTRAMUSCULAR | Status: DC | PRN

## 2024-04-29 SURGICAL SUPPLY — 34 items
BAG COUNTER SPONGE SURGICOUNT (BAG) ×1 IMPLANT
BENZOIN TINCTURE PRP APPL 2/3 (GAUZE/BANDAGES/DRESSINGS) ×1 IMPLANT
BLADE SAGITTAL 25.0X1.19X90 (BLADE) IMPLANT
BLADE SAW SGTL HD 18.5X60.5X1. (BLADE) ×1 IMPLANT
BLADE SURG 21 STRL SS (BLADE) ×1 IMPLANT
BNDG COHESIVE 4X5 TAN STRL LF (GAUZE/BANDAGES/DRESSINGS) IMPLANT
BNDG GAUZE DERMACEA FLUFF 4 (GAUZE/BANDAGES/DRESSINGS) IMPLANT
CANISTER WOUND CARE 500ML ATS (WOUND CARE) IMPLANT
CLEANSER WND VASHE INSTL 34OZ (WOUND CARE) IMPLANT
COVER SURGICAL LIGHT HANDLE (MISCELLANEOUS) ×1 IMPLANT
DRAPE INCISE IOBAN 66X45 STRL (DRAPES) ×1 IMPLANT
DRAPE U-SHAPE 47X51 STRL (DRAPES) ×1 IMPLANT
DRESSING PEEL AND PLAC PRVNA20 (GAUZE/BANDAGES/DRESSINGS) IMPLANT
DRSG ADAPTIC 3X8 NADH LF (GAUZE/BANDAGES/DRESSINGS) IMPLANT
DURAPREP 26ML APPLICATOR (WOUND CARE) ×1 IMPLANT
ELECTRODE REM PT RTRN 9FT ADLT (ELECTROSURGICAL) ×1 IMPLANT
GAUZE PAD ABD 8X10 STRL (GAUZE/BANDAGES/DRESSINGS) IMPLANT
GAUZE SPONGE 4X4 12PLY STRL (GAUZE/BANDAGES/DRESSINGS) IMPLANT
GLOVE BIOGEL PI IND STRL 9 (GLOVE) ×1 IMPLANT
GLOVE SURG ORTHO 9.0 STRL STRW (GLOVE) ×1 IMPLANT
GOWN STRL REUS W/ TWL XL LVL3 (GOWN DISPOSABLE) ×3 IMPLANT
KIT BASIN OR (CUSTOM PROCEDURE TRAY) ×1 IMPLANT
KIT DRSG PREVENA PLUS 7DAY 125 (MISCELLANEOUS) IMPLANT
KIT TURNOVER KIT B (KITS) ×1 IMPLANT
NS IRRIG 1000ML POUR BTL (IV SOLUTION) ×1 IMPLANT
PACK ORTHO EXTREMITY (CUSTOM PROCEDURE TRAY) ×1 IMPLANT
PAD ARMBOARD POSITIONER FOAM (MISCELLANEOUS) ×2 IMPLANT
SPONGE T-LAP 18X18 ~~LOC~~+RFID (SPONGE) IMPLANT
SUT ETHILON 2 0 PSLX (SUTURE) ×2 IMPLANT
TOWEL GREEN STERILE (TOWEL DISPOSABLE) ×1 IMPLANT
TOWEL GREEN STERILE FF (TOWEL DISPOSABLE) ×1 IMPLANT
TUBE CONNECTING 12X1/4 (SUCTIONS) ×1 IMPLANT
WATER STERILE IRR 1000ML POUR (IV SOLUTION) ×1 IMPLANT
YANKAUER SUCT BULB TIP NO VENT (SUCTIONS) ×1 IMPLANT

## 2024-04-29 NOTE — Telephone Encounter (Signed)
 Pt's wife came into office and pt signed medical release form, long term forms, and $20.00

## 2024-04-29 NOTE — Progress Notes (Signed)
 [facility]  Patient admitted for observation overnight pain control.  Pain prescription sent to his pharmacy.   Maurilio Deland Collet PA-C [phone]

## 2024-04-29 NOTE — Anesthesia Postprocedure Evaluation (Signed)
 Anesthesia Post Note  Patient: Blake Jackson  Procedure(s) Performed: LEFT REVISION TRANSMETATARSAL AMPUTATION (Left: Foot)     Patient location during evaluation: PACU Anesthesia Type: MAC Level of consciousness: awake and alert Pain management: pain level controlled Vital Signs Assessment: post-procedure vital signs reviewed and stable Respiratory status: spontaneous breathing, nonlabored ventilation, respiratory function stable and patient connected to nasal cannula oxygen Cardiovascular status: blood pressure returned to baseline and stable Postop Assessment: no apparent nausea or vomiting Anesthetic complications: no   No notable events documented.  Last Vitals:  Vitals:   04/29/24 1230 04/29/24 1244  BP: 129/64 (!) 113/90  Pulse: 84 87  Resp: 13 16  Temp: 36.7 C 36.9 C  SpO2: 95% 97%    Last Pain:  Vitals:   04/29/24 1317  TempSrc:   PainSc: 5                  Thom JONELLE Peoples

## 2024-04-29 NOTE — Transfer of Care (Signed)
 Immediate Anesthesia Transfer of Care Note  Patient: Blake Jackson  Procedure(s) Performed: LEFT REVISION TRANSMETATARSAL AMPUTATION (Left: Foot)  Patient Location: PACU  Anesthesia Type:General  Level of Consciousness: awake, alert , oriented, patient cooperative, and responds to stimulation  Airway & Oxygen Therapy: Patient Spontanous Breathing and Patient connected to face mask oxygen  Post-op Assessment: Report given to RN, Post -op Vital signs reviewed and stable, and Patient moving all extremities X 4  Post vital signs: Reviewed and stable  Last Vitals:  Vitals Value Taken Time  BP 151/98 04/29/24 11:35  Temp    Pulse 96 04/29/24 11:37  Resp 17 04/29/24 11:37  SpO2 100 % 04/29/24 11:37  Vitals shown include unfiled device data.  Last Pain:  Vitals:   04/29/24 0821  TempSrc: Oral         Complications: No notable events documented.

## 2024-04-29 NOTE — Op Note (Signed)
 04/29/2024  11:28 AM  PATIENT:  Blake Jackson    PRE-OPERATIVE DIAGNOSIS:  dehiscence left transmetatarsal  amputation  POST-OPERATIVE DIAGNOSIS:  Same  PROCEDURE: Left show part amputation. Application of vancomycin  powder 1 g. Tissue sent for cultures. Application of 21 cm Prevena wound VAC. Local tissue transfer for wound closure 10 x 4 cm.  SURGEON:  Jerona LULLA Sage, MD  PHYSICIAN ASSISTANT:None ANESTHESIA:   General  PREOPERATIVE INDICATIONS:  Blake Jackson is a  58 y.o. male with a diagnosis of dehiscence left transmetatarsal  amputation who failed conservative measures and elected for surgical management.    The risks benefits and alternatives were discussed with the patient preoperatively including but not limited to the risks of infection, bleeding, nerve injury, cardiopulmonary complications, the need for revision surgery, among others, and the patient was willing to proceed.  OPERATIVE IMPLANTS:   * No implants in log *  @ENCIMAGES @  OPERATIVE FINDINGS: Patient had ischemic tissue deep within the wounds.  Bone and nonviable tissue was sent for cultures.  OPERATIVE PROCEDURE: Patient was brought to the operating room and underwent general anesthetic.  After adequate levels anesthesia obtained patient's left lower extremity was prepped using DuraPrep draped into a sterile field a timeout was called.  A fishmouth incision was made around the wound dehiscence through healthy viable tissue.  This left the wound that was 10 x 4 cm.  A oscillating saw was used to revise the transmetatarsal amputation back to a show part amputation.  Electrocautery was used hemostasis.  Nonviable tissue was excised and sent for cultures.  After hemostasis was obtained the wound was irrigated with Vashe.  The area of nonviable tissue was filled with 1 g vancomycin  powder.  Tissue margins were undermined and local tissue transfer was used to close the wound 10 x 4 cm.  A 21 Prevena wound VAC was  applied this had a good suction fit patient was extubated taken the PACU in stable condition.   DISCHARGE PLANNING:  Antibiotic duration: Continue antibiotics for 24 hours  Weightbearing: Touchdown weightbearing on the left  Pain medication: Opioid pathway  Dressing care/ Wound VAC: Wound VAC  Ambulatory devices: Walker  Discharge to: Plan for observation discharge to home.  Follow-up: In the office 1 week post operative.

## 2024-04-29 NOTE — Anesthesia Procedure Notes (Signed)
 Procedure Name: LMA Insertion Date/Time: 04/29/2024 11:00 AM  Performed by: Erma Thom SAUNDERS, MDPre-anesthesia Checklist: Patient identified, Emergency Drugs available, Suction available and Patient being monitored Patient Re-evaluated:Patient Re-evaluated prior to induction Oxygen Delivery Method: Circle System Utilized Preoxygenation: Pre-oxygenation with 100% oxygen Induction Type: IV induction Ventilation: Mask ventilation without difficulty LMA: LMA inserted LMA Size: 5.0 Number of attempts: 1 Airway Equipment and Method: Bite block Placement Confirmation: positive ETCO2 Tube secured with: Tape Dental Injury: Teeth and Oropharynx as per pre-operative assessment

## 2024-04-29 NOTE — Interval H&P Note (Signed)
 History and Physical Interval Note:  04/29/2024 9:19 AM  Blake Jackson  has presented today for surgery, with the diagnosis of dehiscence left transmetatarsal  amputation.  The various methods of treatment have been discussed with the patient and family. After consideration of risks, benefits and other options for treatment, the patient has consented to  Procedure(s) with comments: AMPUTATION, FOOT, PARTIAL (Left) - LEFT REVISION TRANSMETATARSAL AMPUTATION as a surgical intervention.  The patient's history has been reviewed, patient examined, no change in status, stable for surgery.  I have reviewed the patient's chart and labs.  Questions were answered to the patient's satisfaction.     Sonnie Bias V Jiali Linney

## 2024-04-29 NOTE — Telephone Encounter (Signed)
 Received

## 2024-04-30 ENCOUNTER — Encounter (HOSPITAL_COMMUNITY): Payer: Self-pay | Admitting: Orthopedic Surgery

## 2024-04-30 DIAGNOSIS — T8781 Dehiscence of amputation stump: Secondary | ICD-10-CM | POA: Diagnosis not present

## 2024-04-30 LAB — GLUCOSE, CAPILLARY
Glucose-Capillary: 156 mg/dL — ABNORMAL HIGH (ref 70–99)
Glucose-Capillary: 144 mg/dL — ABNORMAL HIGH (ref 70–99)
Glucose-Capillary: 124 mg/dL — ABNORMAL HIGH (ref 70–99)

## 2024-04-30 NOTE — Progress Notes (Signed)
 Patient ID: Blake Jackson, male   DOB: 1966-07-08, 58 y.o.   MRN: [id number] Patient with a Prevena wound VAC that is functioning well.  Patient may be weightbearing on the left heel.  Plan for discharge today.

## 2024-04-30 NOTE — Progress Notes (Signed)
 Orthopedic Tech Progress Note Patient Details:  Blake Jackson March 20, 1966 [id number]  Ortho Devices Type of Ortho Device: Postop shoe/boot Ortho Device/Splint Location: Left foot Ortho Device/Splint Interventions: Application   Post Interventions Patient Tolerated: Well  Merari Pion E Tziporah Knoke 04/30/2024, 11:09 AM

## 2024-04-30 NOTE — Discharge Summary (Signed)
 Physician Discharge Summary  Patient ID: Blake Jackson MRN: [id number] DOB/AGE: 12/03/1965 58 y.o.  Admit date: 04/29/2024 Discharge date: 04/30/2024  Admission Diagnoses:  Principal Problem:   Non-healing wound of amputation stump (HCC) Active Problems:   Surgical wound, non healing   Dehiscence of amputation stump of left lower extremity (HCC)   Discharge Diagnoses:  Same  Past Medical History:  Diagnosis Date   Anxiety    Arthritis    Asthma    Cellulitis of great toe, left    Diabetes mellitus without complication (HCC)    Hypertension    pt states he no longer has this   PONV (postoperative nausea and vomiting)    pt reports that he had nausea after surgery 03/25/24   Spinal stenosis     Surgeries: Procedure(s): LEFT REVISION TRANSMETATARSAL AMPUTATION on 04/29/2024   Consultants:   Discharged Condition: Improved  Hospital Course: Blake Jackson is an 58 y.o. male who was admitted 04/29/2024 with a chief complaint of No chief complaint on file. , and found to have a diagnosis of Non-healing wound of amputation stump (HCC).  They were brought to the operating room on 04/29/2024 and underwent the above named procedures.    They were given perioperative antibiotics:  Anti-infectives (From admission, onward)    Start     Dose/Rate Route Frequency Ordered Stop   04/29/24 1116  vancomycin  (VANCOCIN ) powder  Status:  Discontinued          As needed 04/29/24 1116 04/29/24 1138   04/29/24 0830  ceFAZolin  (ANCEF ) IVPB 2g/100 mL premix        2 g 200 mL/hr over 30 Minutes Intravenous On call to O.R. 04/29/24 9180 04/29/24 1101     .  They were given compression stockings, early ambulation, and chemoprophylaxis for DVT prophylaxis.  They benefited maximally from their hospital stay and there were no complications.    Recent vital signs:  Vitals:   04/30/24 0016 04/30/24 0726  BP: (!) 90/46 (!) 114/58  Pulse: 64 77  Resp: 16 16  Temp: 98.4 F (36.9 C) 98.2  F (36.8 C)  SpO2: 97% 100%    Recent laboratory studies:  Results for orders placed or performed during the hospital encounter of 04/29/24  Glucose, capillary   Collection Time: 04/29/24  8:20 AM  Result Value Ref Range   Glucose-Capillary 122 (H) 70 - 99 mg/dL  CBC WITH DIFFERENTIAL   Collection Time: 04/29/24  8:30 AM  Result Value Ref Range   WBC 8.0 4.0 - 10.5 K/uL   RBC 3.12 (L) 4.22 - 5.81 MIL/uL   Hemoglobin 9.6 (L) 13.0 - 17.0 g/dL   HCT 70.5 (L) 60.9 - 47.9 %   MCV 94.2 80.0 - 100.0 fL   MCH 30.8 26.0 - 34.0 pg   MCHC 32.7 30.0 - 36.0 g/dL   RDW 87.1 88.4 - 84.4 %   Platelets 398 150 - 400 K/uL   nRBC 0.0 0.0 - 0.2 %   Neutrophils Relative % 79 %   Neutro Abs 6.4 1.7 - 7.7 K/uL   Lymphocytes Relative 7 %   Lymphs Abs 0.6 (L) 0.7 - 4.0 K/uL   Monocytes Relative 11 %   Monocytes Absolute 0.8 0.1 - 1.0 K/uL   Eosinophils Relative 1 %   Eosinophils Absolute 0.1 0.0 - 0.5 K/uL   Basophils Relative 1 %   Basophils Absolute 0.1 0.0 - 0.1 K/uL   Immature Granulocytes 1 %   Abs Immature Granulocytes  0.05 0.00 - 0.07 K/uL  Comprehensive metabolic panel   Collection Time: 04/29/24  8:30 AM  Result Value Ref Range   Sodium 132 (L) 135 - 145 mmol/L   Potassium 4.2 3.5 - 5.1 mmol/L   Chloride 99 98 - 111 mmol/L   CO2 19 (L) 22 - 32 mmol/L   Glucose, Bld 123 (H) 70 - 99 mg/dL   BUN 7 6 - 20 mg/dL   Creatinine, Ser 9.30 0.61 - 1.24 mg/dL   Calcium 9.1 8.9 - 89.6 mg/dL   Total Protein 7.3 6.5 - 8.1 g/dL   Albumin 3.2 (L) 3.5 - 5.0 g/dL   AST 19 15 - 41 U/L   ALT 17 0 - 44 U/L   Alkaline Phosphatase 121 38 - 126 U/L   Total Bilirubin 0.9 0.0 - 1.2 mg/dL   GFR, Estimated >39 >39 mL/min   Anion gap 14 5 - 15  Glucose, capillary   Collection Time: 04/29/24 10:19 AM  Result Value Ref Range   Glucose-Capillary 119 (H) 70 - 99 mg/dL  Glucose, capillary   Collection Time: 04/29/24 10:37 AM  Result Value Ref Range   Glucose-Capillary 125 (H) 70 - 99 mg/dL   Aerobic/Anaerobic Culture w Gram Stain (surgical/deep wound)   Collection Time: 04/29/24 11:11 AM   Specimen: Soft Tissue, Other  Result Value Ref Range   Specimen Description TISSUE    Special Requests ABSCESS LEFT FOOT    Gram Stain NO WBC SEEN NO ORGANISMS SEEN     Culture      CULTURE REINCUBATED FOR BETTER GROWTH Performed at [facility], [address] [place], [place] [address]    Report Status PENDING   Glucose, capillary   Collection Time: 04/29/24 11:36 AM  Result Value Ref Range   Glucose-Capillary 126 (H) 70 - 99 mg/dL  Glucose, capillary   Collection Time: 04/29/24  4:42 PM  Result Value Ref Range   Glucose-Capillary 262 (H) 70 - 99 mg/dL  Glucose, capillary   Collection Time: 04/29/24 10:52 PM  Result Value Ref Range   Glucose-Capillary 205 (H) 70 - 99 mg/dL  Glucose, capillary   Collection Time: 04/30/24  4:30 AM  Result Value Ref Range   Glucose-Capillary 156 (H) 70 - 99 mg/dL  Glucose, capillary   Collection Time: 04/30/24  6:05 AM  Result Value Ref Range   Glucose-Capillary 144 (H) 70 - 99 mg/dL    Discharge Medications:   Allergies as of 04/30/2024   No Known Allergies      Medication List     TAKE these medications    albuterol  108 (90 Base) MCG/ACT inhaler Commonly known as: VENTOLIN  HFA Inhale 2 puffs into the lungs every 6 (six) hours as needed for wheezing or shortness of breath.   ALPRAZolam  1 MG tablet Commonly known as: XANAX  Take 0.5 mg by mouth 4 (four) times daily as needed for anxiety.   doxycycline  100 MG tablet Commonly known as: VIBRA -TABS TAKE 1 TABLET BY MOUTH 2 TIMES A DAY   Farxiga  10 MG Tabs tablet Generic drug: dapagliflozin  propanediol Take 10 mg by mouth in the morning.   ibuprofen  200 MG tablet Commonly known as: ADVIL  Take 400 mg by mouth every 6 (six) hours as needed for moderate pain (pain score 4-6).   metFORMIN  500 MG 24 hr tablet Commonly known as: GLUCOPHAGE -XR Take 500 mg by mouth 2 (two)  times daily.   morphine  30 MG 12 hr tablet Commonly known as: MS CONTIN   Take 30 mg by mouth every 8 (eight) hours.   multivitamin with minerals Tabs tablet Take 1 tablet by mouth daily.   nitroGLYCERIN  0.2 mg/hr patch Commonly known as: NITRODUR - Dosed in mg/24 hr Place 1 patch (0.2 mg total) onto the skin daily.   oxyCODONE -acetaminophen  7.5-325 MG tablet Commonly known as: PERCOCET Take 1 tablet by mouth every 6 (six) hours as needed for severe pain (pain score 7-10). What changed: when to take this   pregabalin  150 MG capsule Commonly known as: LYRICA  Take 150 mg by mouth 2 (two) times daily.        Diagnostic Studies: No results found.  Disposition: Discharge disposition: 01-Home or Self Care       Discharge Instructions     Call MD / Call 911   Complete by: As directed    If you experience chest pain or shortness of breath, CALL 911 and be transported to the hospital emergency room.  If you develope a fever above 101 F, pus (white drainage) or increased drainage or redness at the wound, or calf pain, call your surgeon's office.   Constipation Prevention   Complete by: As directed    Drink plenty of fluids.  Prune juice may be helpful.  You may use a stool softener, such as Colace (over the counter) 100 mg twice a day.  Use MiraLax (over the counter) for constipation as needed.   Diet - low sodium heart healthy   Complete by: As directed    Increase activity slowly as tolerated   Complete by: As directed    Negative Pressure Wound Therapy - Incisional   Complete by: As directed    Prevena wound VAC for discharge.  Patient is to keep this plugged and to keep it charged.   Post-operative opioid taper instructions:   Complete by: As directed    POST-OPERATIVE OPIOID TAPER INSTRUCTIONS: It is important to wean off of your opioid medication as soon as possible. If you do not need pain medication after your surgery it is ok to stop day one. Opioids  include: Codeine, Hydrocodone(Norco, Vicodin), Oxycodone (Percocet, oxycontin ) and hydromorphone  amongst others.  Long term and even short term use of opiods can cause: Increased pain response Dependence Constipation Depression Respiratory depression And more.  Withdrawal symptoms can include Flu like symptoms Nausea, vomiting And more Techniques to manage these symptoms Hydrate well Eat regular healthy meals Stay active Use relaxation techniques(deep breathing, meditating, yoga) Do Not substitute Alcohol to help with tapering If you have been on opioids for less than two weeks and do not have pain than it is ok to stop all together.  Plan to wean off of opioids This plan should start within one week post op of your joint replacement. Maintain the same interval or time between taking each dose and first decrease the dose.  Cut the total daily intake of opioids by one tablet each day Next start to increase the time between doses. The last dose that should be eliminated is the evening dose.           Follow-up Information     Harden Jerona GAILS, MD Follow up in 1 week(s).   Specialty: Orthopedic Surgery Contact information: [address] [place]  [place] [address] [phone]                  Signed: Jerona GAILS Harden 04/30/2024, 10:24 AM

## 2024-05-01 ENCOUNTER — Encounter: Payer: Self-pay | Admitting: Orthopedic Surgery

## 2024-05-02 ENCOUNTER — Other Ambulatory Visit: Payer: Self-pay | Admitting: Orthopedic Surgery

## 2024-05-02 ENCOUNTER — Telehealth: Payer: Self-pay | Admitting: Orthopedic Surgery

## 2024-05-02 ENCOUNTER — Ambulatory Visit: Payer: Self-pay

## 2024-05-02 MED ORDER — OXYCODONE-ACETAMINOPHEN 7.5-325 MG PO TABS: 1.0000 | ORAL_TABLET | Freq: Four times a day (QID) | ORAL | 0 refills | Status: AC | PRN

## 2024-05-02 MED ORDER — LINEZOLID 600 MG PO TABS: 600.0000 mg | ORAL_TABLET | Freq: Two times a day (BID) | ORAL | 0 refills | Status: AC

## 2024-05-02 NOTE — Telephone Encounter (Signed)
 Pt called refill of pain medication. Please send to [facility]. Pt phone number is [phone]

## 2024-05-02 NOTE — Telephone Encounter (Signed)
 Already communicating with patient through messaging on mychart. Waiting for Dr. Harden to send in.

## 2024-05-04 ENCOUNTER — Ambulatory Visit (INDEPENDENT_AMBULATORY_CARE_PROVIDER_SITE_OTHER): Admitting: Family

## 2024-05-04 DIAGNOSIS — Z89432 Acquired absence of left foot: Secondary | ICD-10-CM

## 2024-05-04 LAB — AEROBIC/ANAEROBIC CULTURE W GRAM STAIN (SURGICAL/DEEP WOUND): Gram Stain: NONE SEEN

## 2024-05-04 NOTE — Progress Notes (Signed)
   Post-Op Visit Note   Patient: Blake Jackson           Date of Birth: 01-15-1966           MRN: [id number] Visit Date: 05/04/2024 PCP: Shepard Ade, MD  Chief Complaint:  Chief Complaint  Patient presents with   Left Foot - Routine Post Op    03/25/2024 left TMA    HPI:  HPI The patient is a 58 year old gentleman who is seen status post left transmetatarsal amputation revision July 25 Ortho Exam On examination left foot there is significant edema present in the incisions approximated sutures there is no erythema or active drainage  Visit Diagnoses: No diagnosis found.  Plan: Begin daily Dial soap cleansing.  Dry dressings.  Minimize weightbearing.  Elevate for swelling.  Discussed possibility of Elavil, the patient would like to discuss this with pain management.  He is currently maxed out on his Lyrica  and continues to have significant neuropathic pain in the operative foot. Follow-Up Instructions: No follow-ups on file.   Imaging: No results found.  Orders:  No orders of the defined types were placed in this encounter.  No orders of the defined types were placed in this encounter.    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  Past Surgical History:  Procedure Laterality  Date   AMPUTATION Left 11/18/2023   Procedure: LEFT FOOT AMPUTATION GREAT TOE AND 2ND TOE;  Surgeon: Harden Jerona GAILS, MD;  Location: [facility] OR;  Service: Orthopedics;  Laterality: Left;   AMPUTATION Left 03/25/2024   Procedure: TRANSMETATARSAL AMPUTATION OF THE LEFT FOOT;  Surgeon: Harden Jerona GAILS, MD;  Location[facility] OR;  Service: Orthopedics;  Laterality: Left;   AMPUTATION Left 04/29/2024   Procedure: LEFT REVISION TRANSMETATARSAL AMPUTATION;  Surgeon: Harden Jerona GAILS, MD;  Location: [facility] OR;  Service: Orthopedics;  Laterality: Left;   HERNIA REPAIR     umbilical   Social History   Occupational History   Not on file  Tobacco Use   Smoking status: Never   Smokeless tobacco: Never  Vaping Use   Vaping status: Never Used  Substance and Sexual Activity   Alcohol use: Not Currently   Drug use: Never   Sexual activity: Not on file

## 2024-05-06 ENCOUNTER — Encounter: Admitting: Family

## 2024-05-20 ENCOUNTER — Encounter: Admitting: Family

## 2024-05-23 ENCOUNTER — Ambulatory Visit (INDEPENDENT_AMBULATORY_CARE_PROVIDER_SITE_OTHER): Admitting: Physician Assistant

## 2024-05-23 ENCOUNTER — Encounter: Payer: Self-pay | Admitting: Physician Assistant

## 2024-05-23 DIAGNOSIS — Z89432 Acquired absence of left foot: Secondary | ICD-10-CM

## 2024-05-23 NOTE — Progress Notes (Signed)
 Office Visit Note   Patient: Blake Jackson           Date of Birth: 1966-04-11           MRN: [id number] Visit Date: 05/23/2024              Requested by: Shepard Ade, MD [address] [place],  [place] [address] PCP: Shepard Ade, MD  Chief Complaint  Patient presents with   Left Foot - Routine Post Op    03/25/24 left TMA      HPI:The patient is a 58 year old gentleman who is seen status post left transmetatarsal amputation revision July 25.  He has been using heel weight bearing.  He fell and felt like he bruised his ribs, so he is unable to use the crutches.  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  He denies fever and chills.     Assessment & Plan: Visit Diagnoses: No diagnosis found.  Plan: we discussed that he should elevate his foot in the supine position alternating seat elevation.  Elevation hand out given for demonstration.  I placed him in a XXL compression Vive sock to assist with edema.  Minimize weight bearing on there left foot.  Plan to remove suture in 1 week.    Follow-Up Instructions: Return in about 1 week (around 05/30/2024).   Ortho Exam  Patient is alert, oriented, no adenopathy, well-dressed, normal affect, normal respiratory effort. No cellulitis/erythema.  Moderate edema in the amputation area.  No incision dehiscence.      Imaging: No results found. No images are attached to the encounter.  Labs: Lab Results  Component Value Date   HGBA1C 5.7 (H) 03/25/2024   REPTSTATUS 05/04/2024 FINAL 04/29/2024   GRAMSTAIN NO WBC SEEN NO ORGANISMS SEEN  04/29/2024   CULT  04/29/2024    MODERATE METHICILLIN RESISTANT STAPHYLOCOCCUS AUREUS NO ANAEROBES ISOLATED Performed at [facility], [address], [place], [place] [address]    LABORGA METHICILLIN RESISTANT STAPHYLOCOCCUS AUREUS 04/29/2024     Lab Results  Component Value Date   ALBUMIN 3.2 (L) 04/29/2024    ALBUMIN 3.8 03/25/2024   ALBUMIN 4.8 11/02/2006    No results found for: MG No results found for: VD25OH  No results found for: PREALBUMIN    Latest Ref Rng & Units 04/29/2024    8:30 AM 03/25/2024    9:00 AM 11/18/2023    7:17 AM  CBC EXTENDED  WBC 4.0 - 10.5 K/uL 8.0  6.7  7.0   RBC 4.22 - 5.81 MIL/uL 3.12  3.58  3.63   Hemoglobin 13.0 - 17.0 g/dL 9.6  88.2  88.5   HCT 60.9 - 52.0 % 29.4  33.8  34.5   Platelets 150 - 400 K/uL 398  295  319   NEUT# 1.7 - 7.7 K/uL 6.4  4.3    Lymph# 0.7 - 4.0 K/uL 0.6  1.4       There is no height or weight on file to calculate BMI.  Orders:  No orders of the defined types were placed in this encounter.  No orders of the defined types were placed in this encounter.    Procedures: No procedures performed  Clinical Data: No additional findings.  ROS:  All other systems negative, except as noted in the HPI. Review of Systems  Objective: Vital Signs: There were no vitals taken for this visit.  Specialty Comments:  No specialty comments available.  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 No pertinent family history.  Past Surgical History:  Procedure Laterality Date   AMPUTATION Left 11/18/2023   Procedure: LEFT FOOT AMPUTATION GREAT TOE  AND 2ND TOE;  Surgeon: Harden Jerona GAILS, MD;  Location: [facility] OR;  Service: Orthopedics;  Laterality: Left;   AMPUTATION Left 03/25/2024   Procedure: TRANSMETATARSAL AMPUTATION OF THE LEFT FOOT;  Surgeon: Harden Jerona GAILS, MD;  Location: [facility] OR;  Service: Orthopedics;  Laterality: Left;   AMPUTATION Left 04/29/2024   Procedure: LEFT REVISION TRANSMETATARSAL AMPUTATION;  Surgeon: Harden Jerona GAILS, MD;  Location: [facility] OR;  Service: Orthopedics;  Laterality: Left;   HERNIA REPAIR     umbilical   Social History   Occupational History   Not on file  Tobacco Use   Smoking status: Never   Smokeless tobacco: Never  Vaping Use   Vaping status: Never Used  Substance and Sexual Activity   Alcohol use: Not Currently   Drug use: Never   Sexual activity: Not on file

## 2024-05-30 ENCOUNTER — Ambulatory Visit (INDEPENDENT_AMBULATORY_CARE_PROVIDER_SITE_OTHER): Admitting: Physician Assistant

## 2024-05-30 ENCOUNTER — Encounter: Payer: Self-pay | Admitting: Physician Assistant

## 2024-05-30 DIAGNOSIS — Z89432 Acquired absence of left foot: Secondary | ICD-10-CM

## 2024-05-30 NOTE — Progress Notes (Signed)
 Office Visit Note   Patient: Blake Jackson           Date of Birth: 13-Oct-1965           MRN: [id number] Visit Date: 05/30/2024              Requested by: Shepard Ade, MD [address] [place],  [place] [address] PCP: Shepard Ade, MD  Chief Complaint  Patient presents with   Left Foot - Follow-up    Left TMA 03/25/2024      HPI: The patient is a 58 year old gentleman who is seen status post left transmetatarsal amputation revision July 25.  He has been using heel weight bearing.   Vive compression daily.  Assessment & Plan: Visit Diagnoses:  1. 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  Continue to elevate and wear the post op shoe for protective WB.  Continue with Vive compression sock daily for edema.  When he return we will order custom orthotics, toe box space and carbon fiber insert.   Follow-Up Instructions: Return in about 4 weeks (around 06/27/2024).   Ortho Exam  Patient is alert, oriented, no adenopathy, well-dressed, normal affect, normal respiratory effort. Well healed TMA.  Moderate edema in the foot without cellulitis.  No open wounds.    Imaging: No results found. No images are attached to the encounter.  Labs: Lab Results  Component Value Date   HGBA1C 5.7 (H) 03/25/2024   REPTSTATUS 05/04/2024 FINAL 04/29/2024   GRAMSTAIN NO WBC SEEN NO ORGANISMS SEEN  04/29/2024   CULT  04/29/2024    MODERATE METHICILLIN RESISTANT STAPHYLOCOCCUS AUREUS NO ANAEROBES ISOLATED Performed at [facility], [address] [place], [place] [address]    LABORGA METHICILLIN RESISTANT STAPHYLOCOCCUS AUREUS 04/29/2024     Lab Results  Component Value Date   ALBUMIN 3.2 (L) 04/29/2024   ALBUMIN 3.8 03/25/2024   ALBUMIN 4.8 11/02/2006    No results found for: MG No results found for: VD25OH  No results found for: PREALBUMIN    Latest Ref Rng & Units 04/29/2024    8:30 AM  03/25/2024    9:00 AM 11/18/2023    7:17 AM  CBC EXTENDED  WBC 4.0 - 10.5 K/uL 8.0  6.7  7.0   RBC 4.22 - 5.81 MIL/uL 3.12  3.58  3.63   Hemoglobin 13.0 - 17.0 g/dL 9.6  88.2  88.5   HCT 60.9 - 52.0 % 29.4  33.8  34.5   Platelets 150 - 400 K/uL 398  295  319   NEUT# 1.7 - 7.7 K/uL 6.4  4.3    Lymph# 0.7 - 4.0 K/uL 0.6  1.4       There is no height or weight on file to calculate BMI.  Orders:  No orders of the defined types were placed in this encounter.  No orders of the defined types were placed in this encounter.    Procedures: No procedures performed  Clinical Data: No additional findings.  ROS:  All other systems negative, except as noted in the HPI. Review of Systems  Objective: Vital Signs: There were no vitals taken for this visit.  Specialty Comments:  No specialty comments available.  PMFS History: Patient Active Problem List   Diagnosis Date Noted   Surgical wound, non healing 04/29/2024   Dehiscence of amputation stump of left lower extremity (HCC) 04/29/2024   Non-healing wound of amputation stump (HCC)  04/29/2024   Osteomyelitis of great toe of left foot (HCC) 11/18/2023   RHINITIS, CHRONIC 05/09/2009   ASTHMA 05/09/2009   BACK PAIN, CHRONIC 05/09/2009   Past Medical History:  Diagnosis Date   Anxiety    Arthritis    Asthma    Cellulitis of great toe, left    Diabetes mellitus without complication (HCC)    Hypertension    pt states he no longer has this   PONV (postoperative nausea and vomiting)    pt reports that he had nausea after surgery 03/25/24   Spinal stenosis     History reviewed. No pertinent family history.  Past Surgical History:  Procedure Laterality Date   AMPUTATION Left 11/18/2023   Procedure: LEFT FOOT AMPUTATION GREAT TOE AND 2ND TOE;  Surgeon: Harden Jerona GAILS, MD;  Location: [facility] OR;  Service: Orthopedics;  Laterality: Left;   AMPUTATION Left 03/25/2024   Procedure: TRANSMETATARSAL AMPUTATION OF THE LEFT FOOT;  Surgeon:  Harden Jerona GAILS, MD;  Location[facility] OR;  Service: Orthopedics;  Laterality: Left;   AMPUTATION Left 04/29/2024   Procedure: LEFT REVISION TRANSMETATARSAL AMPUTATION;  Surgeon: Harden Jerona GAILS, MD;  Location: [facility] OR;  Service: Orthopedics;  Laterality: Left;   HERNIA REPAIR     umbilical   Social History   Occupational History   Not on file  Tobacco Use   Smoking status: Never   Smokeless tobacco: Never  Vaping Use   Vaping status: Never Used  Substance and Sexual Activity   Alcohol use: Not Currently   Drug use: Never   Sexual activity: Not on file

## 2024-07-01 ENCOUNTER — Ambulatory Visit: Payer: Self-pay | Admitting: Physician Assistant

## 2024-07-01 DIAGNOSIS — Z89432 Acquired absence of left foot: Secondary | ICD-10-CM

## 2024-07-01 DIAGNOSIS — M869 Osteomyelitis, unspecified: Secondary | ICD-10-CM

## 2024-07-01 NOTE — Progress Notes (Signed)
 Office Visit Note   Patient: Blake Jackson           Date of Birth: 07-07-1966           MRN: [id number] Visit Date: 07/01/2024              Requested by: Shepard Ade, MD [address] [place],  [place] [address] PCP: Shepard Ade, MD  Chief Complaint  Patient presents with   Left Foot - Routine Post Op    Left TMA 03/25/2024      HPI: The patient is a 58 year old gentleman who is seen status post left transmetatarsal amputation revision July 25.  He has been using heel weight bearing.   Vive compression daily.   Assessment & Plan: Visit Diagnoses: No diagnosis found.  Plan: We will have him elevate and continue with the Vive socks for compression.  Once the swelling is better controlled we will have him fitted with a spacer and custom orthotics.    Follow-Up Instructions: Return in about 4 weeks (around 07/29/2024).   Ortho Exam  Patient is alert, oriented, no adenopathy, well-dressed, normal affect, normal respiratory effort. Well healed TMA.  He continues to have edema.      Imaging: No results found.    Labs: Lab Results  Component Value Date   HGBA1C 5.7 (H) 03/25/2024   REPTSTATUS 05/04/2024 FINAL 04/29/2024   GRAMSTAIN NO WBC SEEN NO ORGANISMS SEEN  04/29/2024   CULT  04/29/2024    MODERATE METHICILLIN RESISTANT STAPHYLOCOCCUS AUREUS NO ANAEROBES ISOLATED Performed at [facility], [address], [place], [place] [address]    LABORGA METHICILLIN RESISTANT STAPHYLOCOCCUS AUREUS 04/29/2024     Lab Results  Component Value Date   ALBUMIN 3.2 (L) 04/29/2024   ALBUMIN 3.8 03/25/2024   ALBUMIN 4.8 11/02/2006    No results found for: MG No results found for: VD25OH  No results found for: PREALBUMIN    Latest Ref Rng & Units 04/29/2024    8:30 AM 03/25/2024    9:00 AM 11/18/2023    7:17 AM  CBC EXTENDED  WBC 4.0 - 10.5 K/uL 8.0  6.7  7.0   RBC 4.22 - 5.81 MIL/uL 3.12  3.58  3.63   Hemoglobin 13.0 - 17.0 g/dL 9.6   88.2  88.5   HCT 39.0 - 52.0 % 29.4  33.8  34.5   Platelets 150 - 400 K/uL 398  295  319   NEUT# 1.7 - 7.7 K/uL 6.4  4.3    Lymph# 0.7 - 4.0 K/uL 0.6  1.4       There is no height or weight on file to calculate BMI.  Orders:  No orders of the defined types were placed in this encounter.  No orders of the defined types were placed in this encounter.    Procedures: No procedures performed  Clinical Data: No additional findings.  ROS:  All other systems negative, except as noted in the HPI. Review of Systems  Objective: Vital Signs: There were no vitals taken for this visit.  Specialty Comments:  No specialty comments available.  PMFS History: Patient Active Problem List   Diagnosis Date Noted   Surgical wound, non healing 04/29/2024   Dehiscence of amputation stump of left lower extremity (HCC) 04/29/2024   Non-healing wound of amputation stump (HCC) 04/29/2024   Osteomyelitis of great toe of left foot (HCC) 11/18/2023   RHINITIS, CHRONIC 05/09/2009   ASTHMA 05/09/2009   BACK PAIN, CHRONIC 05/09/2009  Past Medical History:  Diagnosis Date   Anxiety    Arthritis    Asthma    Cellulitis of great toe, left    Diabetes mellitus without complication (HCC)    Hypertension    pt states he no longer has this   PONV (postoperative nausea and vomiting)    pt reports that he had nausea after surgery 03/25/24   Spinal stenosis     History reviewed. No pertinent family history.  Past Surgical History:  Procedure Laterality Date   AMPUTATION Left 11/18/2023   Procedure: LEFT FOOT AMPUTATION GREAT TOE AND 2ND TOE;  Surgeon: Harden Jerona GAILS, MD;  Location: [facility] OR;  Service: Orthopedics;  Laterality: Left;   AMPUTATION Left 03/25/2024   Procedure: TRANSMETATARSAL AMPUTATION OF THE LEFT FOOT;  Surgeon: Harden Jerona GAILS, MD;  Location: [facility] OR;  Service: Orthopedics;  Laterality: Left;   AMPUTATION Left 04/29/2024   Procedure: LEFT REVISION TRANSMETATARSAL AMPUTATION;  Surgeon:  Harden Jerona GAILS, MD;  Location: [facility] OR;  Service: Orthopedics;  Laterality: Left;   HERNIA REPAIR     umbilical   Social History   Occupational History   Not on file  Tobacco Use   Smoking status: Never   Smokeless tobacco: Never  Vaping Use   Vaping status: Never Used  Substance and Sexual Activity   Alcohol use: Not Currently   Drug use: Never   Sexual activity: Not on file

## 2024-07-03 ENCOUNTER — Encounter: Payer: Self-pay | Admitting: Physician Assistant

## 2024-07-11 ENCOUNTER — Telehealth: Payer: Self-pay | Admitting: Family

## 2024-07-11 NOTE — Telephone Encounter (Signed)
 Called t and left vm that [facility] will not be office but can switch pt to [facility] on same day 10/17. [facility] only has mornings on that day. Please switch appt to post op any date before 10/26

## 2024-07-14 ENCOUNTER — Telehealth: Payer: Self-pay | Admitting: Physician Assistant

## 2024-07-14 NOTE — Telephone Encounter (Signed)
 Called pt 2X and lef tvm for pt to r/s with [facility] or change to [facility] or [facility] earliest appt

## 2024-07-21 ENCOUNTER — Ambulatory Visit (INDEPENDENT_AMBULATORY_CARE_PROVIDER_SITE_OTHER): Payer: Self-pay | Admitting: Physician Assistant

## 2024-07-21 DIAGNOSIS — Z89432 Acquired absence of left foot: Secondary | ICD-10-CM

## 2024-07-22 ENCOUNTER — Encounter: Payer: Self-pay | Admitting: Physician Assistant

## 2024-07-22 ENCOUNTER — Ambulatory Visit: Payer: Self-pay | Admitting: Physician Assistant

## 2024-07-22 NOTE — Progress Notes (Signed)
 Office Visit Note   Patient: Blake Jackson           Date of Birth: Oct 12, 1965           MRN: [id number] Visit Date: 07/21/2024              Requested by: Shepard Ade, MD [address] [place],  [place] [address] PCP: Shepard Ade, MD  Chief Complaint  Patient presents with   Left Foot - Follow-up    Left TMA 03/25/2024      HPI: The patient is a 58 year old gentleman who is seen status post left transmetatarsal amputation revision July 25.  We placed him in a Vive compression sock   Assessment & Plan: Visit Diagnoses: No diagnosis found.  Plan: He was given a script for [facility] for custom inserts and forefoot spacer.  Elevation when at rest.  Continue Vive sock.  Follow-Up Instructions: No follow-ups on file.   Ortho Exam  Patient is alert, oriented, no adenopathy, well-dressed, normal affect, normal respiratory effort. Decrease edema in the TMA stump.  His skin is dry, but healed at the incision.  The stump is soft.  No cellulitis.      Imaging: No results found.    Labs: Lab Results  Component Value Date   HGBA1C 5.7 (H) 03/25/2024   REPTSTATUS 05/04/2024 FINAL 04/29/2024   GRAMSTAIN NO WBC SEEN NO ORGANISMS SEEN  04/29/2024   CULT  04/29/2024    MODERATE METHICILLIN RESISTANT STAPHYLOCOCCUS AUREUS NO ANAEROBES ISOLATED Performed at [facility], [address], [place], [place] [address]    LABORGA METHICILLIN RESISTANT STAPHYLOCOCCUS AUREUS 04/29/2024     Lab Results  Component Value Date   ALBUMIN 3.2 (L) 04/29/2024   ALBUMIN 3.8 03/25/2024   ALBUMIN 4.8 11/02/2006    No results found for: MG No results found for: VD25OH  No results found for: PREALBUMIN    Latest Ref Rng & Units 04/29/2024    8:30 AM 03/25/2024    9:00 AM 11/18/2023    7:17 AM  CBC EXTENDED  WBC 4.0 - 10.5 K/uL 8.0  6.7  7.0   RBC 4.22 - 5.81 MIL/uL 3.12  3.58  3.63   Hemoglobin 13.0 - 17.0 g/dL 9.6  88.2  88.5   HCT 60.9 - 52.0 % 29.4   33.8  34.5   Platelets 150 - 400 K/uL 398  295  319   NEUT# 1.7 - 7.7 K/uL 6.4  4.3    Lymph# 0.7 - 4.0 K/uL 0.6  1.4       There is no height or weight on file to calculate BMI.  Orders:  No orders of the defined types were placed in this encounter.  No orders of the defined types were placed in this encounter.    Procedures: No procedures performed  Clinical Data: No additional findings.  ROS:  All other systems negative, except as noted in the HPI. Review of Systems  Objective: Vital Signs: There were no vitals taken for this visit.  Specialty Comments:  No specialty comments available.  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  Past Surgical History:  Procedure Laterality Date   AMPUTATION Left 11/18/2023   Procedure: LEFT FOOT AMPUTATION GREAT TOE AND 2ND TOE;  Surgeon: Harden Jerona GAILS, MD;  Location: [facility] OR;  Service: Orthopedics;  Laterality: Left;   AMPUTATION Left 03/25/2024   Procedure: TRANSMETATARSAL AMPUTATION OF THE LEFT FOOT;  Surgeon: Harden Jerona GAILS, MD;  Location: [facility] OR;  Service: Orthopedics;  Laterality: Left;   AMPUTATION Left 04/29/2024   Procedure: LEFT REVISION TRANSMETATARSAL AMPUTATION;  Surgeon: Harden Jerona GAILS, MD;  Location: [facility] OR;  Service: Orthopedics;   Laterality: Left;   HERNIA REPAIR     umbilical   Social History   Occupational History   Not on file  Tobacco Use   Smoking status: Never   Smokeless tobacco: Never  Vaping Use   Vaping status: Never Used  Substance and Sexual Activity   Alcohol use: Not Currently   Drug use: Never   Sexual activity: Not on file

## 2024-08-08 ENCOUNTER — Encounter: Payer: Self-pay | Admitting: Radiology

## 2024-09-08 ENCOUNTER — Ambulatory Visit (INDEPENDENT_AMBULATORY_CARE_PROVIDER_SITE_OTHER): Payer: Self-pay | Admitting: Physician Assistant

## 2024-09-08 DIAGNOSIS — Z89432 Acquired absence of left foot: Secondary | ICD-10-CM

## 2024-09-08 NOTE — Progress Notes (Unsigned)
 Office Visit Note   Patient: Blake Jackson           Date of Birth: 1966/04/14           MRN: [id number] Visit Date: 09/08/2024              Requested by: Shepard Ade, MD [address] [place],  [place] [address] PCP: Shepard Ade, MD  Chief Complaint  Patient presents with   Left Foot - Wound Check    Hx TMA      HPI: The patient is a 58 year old gentleman who is seen status post left transmetatarsal amputation revision July 25.  We placed him in a Vive compression sock.  S/P TMA left foot 03/25/24 and revision 04/29/24.   Assessment & Plan: Visit Diagnoses: No diagnosis found.  Plan: The stump is cleaned with Vashe.  He will use Lotion on the areas of dry scaly skin.  I gave him a prescription to go to [facility] for a stump sock to increase the compression and reduce edema in the stump.  Elevation when at rest.    Follow-Up Instructions: Return in about 4 weeks (around 10/06/2024).   Ortho Exam  Patient is alert, oriented, no adenopathy, well-dressed, normal affect, normal respiratory effort. He continues to have mild edema with cracking skin and superficial plantar TMA wounds.   The TMA circumference 3 cm form incision measures 29 cm.  Flat pink skin 2 cm diameter without cellulitis, depth or drainage.  Stump is warm to touch.         Imaging: No results found. No images are attached to the encounter.  Labs: Lab Results  Component Value Date   HGBA1C 5.7 (H) 03/25/2024   REPTSTATUS 05/04/2024 FINAL 04/29/2024   GRAMSTAIN NO WBC SEEN NO ORGANISMS SEEN  04/29/2024   CULT  04/29/2024    MODERATE METHICILLIN RESISTANT STAPHYLOCOCCUS AUREUS NO ANAEROBES ISOLATED Performed at [facility], [address], [place], [place] [address]    LABORGA METHICILLIN RESISTANT STAPHYLOCOCCUS AUREUS 04/29/2024     Lab Results  Component Value Date   ALBUMIN 3.2 (L) 04/29/2024   ALBUMIN 3.8 03/25/2024   ALBUMIN 4.8 11/02/2006    No results found  for: MG No results found for: VD25OH  No results found for: PREALBUMIN    Latest Ref Rng & Units 04/29/2024    8:30 AM 03/25/2024    9:00 AM 11/18/2023    7:17 AM  CBC EXTENDED  WBC 4.0 - 10.5 K/uL 8.0  6.7  7.0   RBC 4.22 - 5.81 MIL/uL 3.12  3.58  3.63   Hemoglobin 13.0 - 17.0 g/dL 9.6  88.2  88.5   HCT 60.9 - 52.0 % 29.4  33.8  34.5   Platelets 150 - 400 K/uL 398  295  319   NEUT# 1.7 - 7.7 K/uL 6.4  4.3    Lymph# 0.7 - 4.0 K/uL 0.6  1.4       There is no height or weight on file to calculate BMI.  Orders:  No orders of the defined types were placed in this encounter.  No orders of the defined types were placed in this encounter.    Procedures: No procedures performed  Clinical Data: No additional findings.  ROS:  All other systems negative, except as noted in the HPI. Review of Systems  Objective: Vital Signs: There were no vitals taken for this visit.  Specialty Comments:  No specialty comments available.  PMFS History: Patient  Active Problem List   Diagnosis Date Noted   Surgical wound, non healing 04/29/2024   Dehiscence of amputation stump of left lower extremity (HCC) 04/29/2024   Non-healing wound of amputation stump (HCC) 04/29/2024   Osteomyelitis of great toe of left foot (HCC) 11/18/2023   RHINITIS, CHRONIC 05/09/2009   ASTHMA 05/09/2009   BACK PAIN, CHRONIC 05/09/2009   Past Medical History:  Diagnosis Date   Anxiety    Arthritis    Asthma    Cellulitis of great toe, left    Diabetes mellitus without complication (HCC)    Hypertension    pt states he no longer has this   PONV (postoperative nausea and vomiting)    pt reports that he had nausea after surgery 03/25/24   Spinal stenosis     History reviewed. No pertinent family history.  Past Surgical History:  Procedure Laterality Date   AMPUTATION Left 11/18/2023   Procedure: LEFT FOOT AMPUTATION GREAT TOE AND 2ND TOE;  Surgeon: Harden Jerona GAILS, MD;  Location: [facility] OR;  Service:  Orthopedics;  Laterality: Left;   AMPUTATION Left 03/25/2024   Procedure: TRANSMETATARSAL AMPUTATION OF THE LEFT FOOT;  Surgeon: Harden Jerona GAILS, MD;  Location: [facility] OR;  Service: Orthopedics;  Laterality: Left;   AMPUTATION Left 04/29/2024   Procedure: LEFT REVISION TRANSMETATARSAL AMPUTATION;  Surgeon: Harden Jerona GAILS, MD;  Location: [facility] OR;  Service: Orthopedics;  Laterality: Left;   HERNIA REPAIR     umbilical   Social History   Occupational History   Not on file  Tobacco Use   Smoking status: Never   Smokeless tobacco: Never  Vaping Use   Vaping status: Never Used  Substance and Sexual Activity   Alcohol use: Not Currently   Drug use: Never   Sexual activity: Not on file

## 2024-09-11 ENCOUNTER — Encounter: Payer: Self-pay | Admitting: Physician Assistant
